# Patient Record
Sex: Female | Born: 1990 | Race: White | Hispanic: No | Marital: Married | Smoking: Former smoker
Health system: Southern US, Community
[De-identification: ages and names within clinical notes are randomized; demographics above are authoritative.]

## PROBLEM LIST (undated history)

## (undated) ENCOUNTER — Emergency Department (HOSPITAL_COMMUNITY): Admission: EM | Disposition: A | Discharge status: Other Acute Inpatient Hospital | Payer: Medicaid Other

## (undated) ENCOUNTER — Inpatient Hospital Stay (HOSPITAL_COMMUNITY): Payer: Self-pay

## (undated) ENCOUNTER — Emergency Department (HOSPITAL_COMMUNITY): Admission: EM | Payer: Medicaid Other | Source: Home / Self Care

## (undated) DIAGNOSIS — F419 Anxiety disorder, unspecified: Secondary | ICD-10-CM

## (undated) DIAGNOSIS — G43909 Migraine, unspecified, not intractable, without status migrainosus: Secondary | ICD-10-CM

## (undated) DIAGNOSIS — R87619 Unspecified abnormal cytological findings in specimens from cervix uteri: Secondary | ICD-10-CM

## (undated) HISTORY — DX: Unspecified abnormal cytological findings in specimens from cervix uteri: R87.619

## (undated) HISTORY — DX: Anxiety disorder, unspecified: F41.9

## (undated) HISTORY — PX: TONSILLECTOMY: SUR1361

---
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
## 2009-04-12 ENCOUNTER — Emergency Department (HOSPITAL_COMMUNITY): Admission: EM | Admit: 2009-04-12 | Discharge: 2009-04-12 | Payer: Self-pay | Admitting: Family Medicine

## 2009-10-26 ENCOUNTER — Emergency Department (HOSPITAL_COMMUNITY): Admission: EM | Admit: 2009-10-26 | Discharge: 2009-10-26 | Payer: Self-pay | Admitting: Family Medicine

## 2009-11-19 ENCOUNTER — Emergency Department (HOSPITAL_COMMUNITY): Admission: EM | Admit: 2009-11-19 | Discharge: 2009-11-19 | Payer: Self-pay | Admitting: Emergency Medicine

## 2010-10-30 LAB — POCT PREGNANCY, URINE: Preg Test, Ur: NEGATIVE
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
## 2011-03-03 ENCOUNTER — Emergency Department (HOSPITAL_COMMUNITY)
Admission: EM | Admit: 2011-03-03 | Discharge: 2011-03-03 | Disposition: A | Discharge status: Home / Self Care | Payer: Medicaid Other | Attending: Emergency Medicine | Admitting: Emergency Medicine

## 2011-03-03 DIAGNOSIS — R10813 Right lower quadrant abdominal tenderness: Secondary | ICD-10-CM | POA: Insufficient documentation

## 2011-03-03 DIAGNOSIS — N949 Unspecified condition associated with female genital organs and menstrual cycle: Secondary | ICD-10-CM | POA: Insufficient documentation

## 2011-03-03 DIAGNOSIS — R1031 Right lower quadrant pain: Secondary | ICD-10-CM | POA: Insufficient documentation

## 2011-03-03 DIAGNOSIS — O99891 Other specified diseases and conditions complicating pregnancy: Secondary | ICD-10-CM | POA: Insufficient documentation

## 2011-03-03 LAB — WET PREP, GENITAL
Trich, Wet Prep: NONE SEEN
Yeast Wet Prep HPF POC: NONE SEEN

## 2011-03-03 LAB — URINALYSIS, ROUTINE W REFLEX MICROSCOPIC
Bilirubin Urine: NEGATIVE
Glucose, UA: NEGATIVE mg/dL
Hgb urine dipstick: NEGATIVE
Nitrite: NEGATIVE
Protein, ur: NEGATIVE mg/dL
Specific Gravity, Urine: 1.021 (ref 1.005–1.030)

## 2011-04-16 LAB — CBC
HCT: 36.5
Hemoglobin: 12.7
MCHC: 34.7
MCV: 89.1
RBC: 4.09
RDW: 13.3

## 2011-04-16 LAB — GC/CHLAMYDIA PROBE AMP, URINE: GC Probe Amp, Urine: NEGATIVE

## 2011-04-16 LAB — HEPATIC FUNCTION PANEL
AST: 17
Alkaline Phosphatase: 56
Bilirubin, Direct: 0.1
Indirect Bilirubin: 0.3
Total Bilirubin: 0.4
Total Protein: 6.9

## 2011-04-16 LAB — URINALYSIS, ROUTINE W REFLEX MICROSCOPIC
Glucose, UA: NEGATIVE
Specific Gravity, Urine: 1.027
Urobilinogen, UA: 0.2
pH: 5.5

## 2011-04-16 LAB — DIFFERENTIAL
Basophils Absolute: 0
Basophils Relative: 1
Eosinophils Relative: 3
Lymphocytes Relative: 43
Monocytes Absolute: 0.6
Neutro Abs: 3.4

## 2011-04-16 LAB — DRUGS OF ABUSE SCREEN W/O ALC, ROUTINE URINE
Benzodiazepines.: NEGATIVE
Creatinine,U: 188.8
Marijuana Metabolite: NEGATIVE
Methadone: NEGATIVE
Opiate Screen, Urine: NEGATIVE
Phencyclidine (PCP): NEGATIVE

## 2011-04-16 LAB — BASIC METABOLIC PANEL
Chloride: 107
Potassium: 3.9

## 2011-04-16 LAB — PREGNANCY, URINE: Preg Test, Ur: NEGATIVE

## 2011-04-16 LAB — GAMMA GT: GGT: 10

## 2011-04-16 LAB — T4, FREE: Free T4: 1.06

## 2011-07-25 ENCOUNTER — Inpatient Hospital Stay (HOSPITAL_COMMUNITY): Admission: AD | Admit: 2011-07-25 | Payer: Self-pay | Source: Ambulatory Visit | Admitting: Obstetrics and Gynecology

## 2011-11-02 ENCOUNTER — Emergency Department (INDEPENDENT_AMBULATORY_CARE_PROVIDER_SITE_OTHER)
Admission: EM | Admit: 2011-11-02 | Discharge: 2011-11-02 | Disposition: A | Discharge status: Home / Self Care | Payer: Self-pay | Source: Home / Self Care | Attending: Emergency Medicine | Admitting: Emergency Medicine

## 2011-11-02 ENCOUNTER — Encounter (HOSPITAL_COMMUNITY): Payer: Self-pay | Admitting: Emergency Medicine

## 2011-11-02 DIAGNOSIS — L03011 Cellulitis of right finger: Secondary | ICD-10-CM

## 2011-11-02 DIAGNOSIS — L03019 Cellulitis of unspecified finger: Secondary | ICD-10-CM

## 2011-11-02 MED ORDER — LIDOCAINE HCL (PF) 2 % IJ SOLN
5.0000 mL | Freq: Once | INTRAMUSCULAR | Status: AC
  Administered 2011-11-02: 5 mL

## 2011-11-02 MED ORDER — CEPHALEXIN 500 MG PO CAPS: 500.0000 mg | ORAL_CAPSULE | Freq: Four times a day (QID) | ORAL | Status: AC

## 2011-11-02 MED ORDER — BACITRACIN 500 UNIT/GM EX OINT
1.0000 "application " | TOPICAL_OINTMENT | Freq: Once | CUTANEOUS | Status: AC
  Administered 2011-11-02: 1 via TOPICAL

## 2011-11-02 NOTE — ED Notes (Signed)
Right thumb infection.  Patient noticed pain, redness, pus approx 4-5 days ago.  Patient bites nails, cuticles.

## 2011-11-02 NOTE — Discharge Instructions (Signed)
Paronychia Paronychia is an inflammatory reaction involving the folds of the skin surrounding the fingernail. This is commonly caused by an infection in the skin around a nail. The most common cause of paronychia is frequent wetting of the hands (as seen with bartenders, food servers, nurses or others who wet their hands). This makes the skin around the fingernail susceptible to infection by bacteria (germs) or fungus. Other predisposing factors are:  Aggressive manicuring.   Nail biting.   Thumb sucking.  The most common cause is a staphylococcal (a type of germ) infection, or a fungal (Candida) infection. When caused by a germ, it usually comes on suddenly with redness, swelling, pus and is often painful. It may get under the nail and form an abscess (collection of pus), or form an abscess around the nail. If the nail itself is infected with a fungus, the treatment is usually prolonged and may require oral medicine for up to one year. Your caregiver will determine the length of time treatment is required. The paronychia caused by bacteria (germs) may largely be avoided by not pulling on hangnails or picking at cuticles. When the infection occurs at the tips of the finger it is called felon. When the cause of paronychia is from the herpes simplex virus (HSV) it is called herpetic whitlow. TREATMENT  When an abscess is present treatment is often incision and drainage. This means that the abscess must be cut open so the pus can get out. When this is done, the following home care instructions should be followed. HOME CARE INSTRUCTIONS   It is important to keep the affected fingers very dry. Rubber or plastic gloves over cotton gloves should be used whenever the hand must be placed in water.   Keep wound clean, dry and dressed as suggested by your caregiver between warm soaks or warm compresses.   Soak in warm water for fifteen to twenty minutes three to four times per day for bacterial infections.  Fungal infections are very difficult to treat, so often require treatment for long periods of time.   For bacterial (germ) infections take antibiotics (medicine which kill germs) as directed and finish the prescription, even if the problem appears to be solved before the medicine is gone.   Only take over-the-counter or prescription medicines for pain, discomfort, or fever as directed by your caregiver.  SEEK IMMEDIATE MEDICAL CARE IF:  You have redness, swelling, or increasing pain in the wound.   You notice pus coming from the wound.   You have a fever.   You notice a bad smell coming from the wound or dressing.  Document Released: 01/05/2001 Document Revised: 07/01/2011 Document Reviewed: 09/06/2008 [facility] Patient Information 2012 [place], [place].  Go to www.goodrx.com to look up your medications. This will give you a list of where you can find your prescriptions at the most affordable prices.   RESOURCE GUIDE   Insufficient Money for Medicine Contact [employer]:  call "211" or [facility] [phone].  No Primary Care Doctor Call [facility]  [phone] Other agencies that provide inexpensive medical care    [facility]  [phone]    [facility]  [phone]    [facility]  [phone]    [facility]  [phone] [address] [place] [address]    [facility]  [phone]    [facility]  [phone] [facility] [phone]   [address] [place] [address] [place], [place]  [address]  [facility] Resources  [facility]     [employer]                          [facility]. [address] [place]                       [address]      [address] [place] [address]   [phone] (After Hours)

## 2011-11-04 NOTE — ED Provider Notes (Signed)
History     CSN: [id number]  Arrival date & time 11/02/11  4098   First MD Initiated Contact with Patient 11/02/11 1936      Chief Complaint  Patient presents with  . Hand Pain    (Consider location/radiation/quality/duration/timing/severity/associated sxs/prior treatment) HPI Comments: Patient is a right-handed female who reports pain, swelling at the nail fold on her right thumb. No nausea, vomiting, redness extending up her thumb, numbness.. States she bites her nails. Pain is worse with palpation. No alleviating factors. Patient has not tried anything for this.  States tetanus is up-to-date. Patient is currently breast-feeding.  ROS as noted in HPI. All other ROS negative.   Patient is a 21 y.o. female presenting with hand pain. The history is provided by the patient. No language interpreter was used.  Hand Pain This is a new problem. The current episode started more than 2 days ago. The problem occurs constantly. The problem has been gradually worsening. The symptoms are aggravated by nothing. The symptoms are relieved by nothing. She has tried nothing for the symptoms.    Past Medical History  Diagnosis Date  . Breast feeding status of mother     Past Surgical History  Procedure Date  . Tonsillectomy     History reviewed. No pertinent family history.  History  Substance Use Topics  . Smoking status: Never Smoker   . Smokeless tobacco: Not on file  . Alcohol Use: No    OB History    Grav Para Term Preterm Abortions TAB SAB Ect Mult Living                  Review of Systems  Allergies  Review of patient's allergies indicates no known allergies.  Home Medications   Current Outpatient Rx  Name Route Sig Dispense Refill  . BACITRACIN-NEOMYCIN-POLYMYXIN [id number] EX OINT Topical Apply topically every 12 (twelve) hours. apply to eye    . NORETHINDRONE 0.35 MG PO TABS Oral Take 1 tablet by mouth daily.    . CEPHALEXIN 500 MG PO CAPS Oral Take 1 capsule (500  mg total) by mouth 4 (four) times daily. X 10 days 40 capsule 0    LMP 10/15/2011  Physical Exam  Nursing note and vitals reviewed. Constitutional: She is oriented to person, place, and time. She appears well-developed and well-nourished. No distress.  HENT:  Head: Normocephalic and atraumatic.  Eyes: Conjunctivae and EOM are normal.  Neck: Normal range of motion.  Cardiovascular: Normal rate.   Pulmonary/Chest: Effort normal.  Abdominal: She exhibits no distension.  Musculoskeletal: Normal range of motion.       Right hand: She exhibits normal range of motion and normal capillary refill. Normal strength noted.       Paronychia right thumb. No redness going up her digit. Sensation grossly intact.  Neurological: She is alert and oriented to person, place, and time.  Skin: Skin is warm and dry.  Psychiatric: She has a normal mood and affect. Her behavior is normal. Judgment and thought content normal.    ED Course  INCISION AND DRAINAGE Date/Time: 11/02/2011 12:43 PM Performed by: Luiz Blare Authorized by: Luiz Blare Consent: Verbal consent obtained. Risks and benefits: risks, benefits and alternatives were discussed Consent given by: patient Patient understanding: patient states understanding of the procedure being performed Patient consent: the patient's understanding of the procedure matches consent given Required items: required blood products, implants, devices, and special equipment available Patient identity confirmed: verbally with patient Time out: Immediately  prior to procedure a "time out" was called to verify the correct patient, procedure, equipment, support staff and site/side marked as required. Indications for incision and drainage: Paronychia. Location: Right thumb. Anesthesia: local infiltration Local anesthetic: lidocaine 2% without epinephrine Anesthetic total: 5 ml Patient sedated: no Scalpel size: 11 Incision type: single  straight Complexity: simple Drainage: purulent and bloody Drainage amount: moderate Wound treatment: wound left open Patient tolerance: Patient tolerated the procedure well with no immediate complications. Comments: Applied bacitracin and sterile pressure dressing.   (including critical care time)  Labs Reviewed - No data to display No results found.   1. Paronychia of right thumb       MDM          Luiz Blare, MD 11/04/11 1247

## 2013-02-16 ENCOUNTER — Encounter (HOSPITAL_COMMUNITY): Payer: Self-pay | Admitting: Emergency Medicine

## 2013-02-16 ENCOUNTER — Emergency Department (HOSPITAL_COMMUNITY): Payer: Self-pay

## 2013-02-16 ENCOUNTER — Emergency Department (HOSPITAL_COMMUNITY)
Admission: EM | Admit: 2013-02-16 | Discharge: 2013-02-16 | Disposition: A | Discharge status: Home / Self Care | Payer: Self-pay | Attending: Emergency Medicine | Admitting: Emergency Medicine

## 2013-02-16 DIAGNOSIS — Z8679 Personal history of other diseases of the circulatory system: Secondary | ICD-10-CM | POA: Insufficient documentation

## 2013-02-16 DIAGNOSIS — IMO0002 Reserved for concepts with insufficient information to code with codable children: Secondary | ICD-10-CM | POA: Insufficient documentation

## 2013-02-16 DIAGNOSIS — Z3490 Encounter for supervision of normal pregnancy, unspecified, unspecified trimester: Secondary | ICD-10-CM

## 2013-02-16 DIAGNOSIS — R110 Nausea: Secondary | ICD-10-CM | POA: Insufficient documentation

## 2013-02-16 DIAGNOSIS — R42 Dizziness and giddiness: Secondary | ICD-10-CM | POA: Insufficient documentation

## 2013-02-16 DIAGNOSIS — Z3201 Encounter for pregnancy test, result positive: Secondary | ICD-10-CM | POA: Insufficient documentation

## 2013-02-16 DIAGNOSIS — O9989 Other specified diseases and conditions complicating pregnancy, childbirth and the puerperium: Secondary | ICD-10-CM | POA: Insufficient documentation

## 2013-02-16 DIAGNOSIS — R109 Unspecified abdominal pain: Secondary | ICD-10-CM | POA: Insufficient documentation

## 2013-02-16 DIAGNOSIS — N898 Other specified noninflammatory disorders of vagina: Secondary | ICD-10-CM | POA: Insufficient documentation

## 2013-02-16 DIAGNOSIS — Z79899 Other long term (current) drug therapy: Secondary | ICD-10-CM | POA: Insufficient documentation

## 2013-02-16 HISTORY — DX: Migraine, unspecified, not intractable, without status migrainosus: G43.909

## 2013-02-16 LAB — CBC WITH DIFFERENTIAL/PLATELET
Eosinophils Relative: 1 % (ref 0–5)
Hemoglobin: 13.6 g/dL (ref 12.0–15.0)
Lymphocytes Relative: 23 % (ref 12–46)
Lymphs Abs: 2.2 10*3/uL (ref 0.7–4.0)
MCH: 31.9 pg (ref 26.0–34.0)
MCV: 88.5 fL (ref 78.0–100.0)
Monocytes Relative: 6 % (ref 3–12)
Neutrophils Relative %: 70 % (ref 43–77)
Platelets: 262 10*3/uL (ref 150–400)
RBC: 4.27 MIL/uL (ref 3.87–5.11)
WBC: 9.7 10*3/uL (ref 4.0–10.5)

## 2013-02-16 LAB — URINALYSIS, ROUTINE W REFLEX MICROSCOPIC
Bilirubin Urine: NEGATIVE
Glucose, UA: NEGATIVE mg/dL
Hgb urine dipstick: NEGATIVE
Specific Gravity, Urine: 1.026 (ref 1.005–1.030)

## 2013-02-16 LAB — BASIC METABOLIC PANEL
BUN: 12 mg/dL (ref 6–23)
CO2: 19 mEq/L (ref 19–32)
Glucose, Bld: 82 mg/dL (ref 70–99)
Potassium: 4 mEq/L (ref 3.5–5.1)
Sodium: 136 mEq/L (ref 135–145)

## 2013-02-16 LAB — WET PREP, GENITAL
Clue Cells Wet Prep HPF POC: NONE SEEN
Trich, Wet Prep: NONE SEEN
Yeast Wet Prep HPF POC: NONE SEEN

## 2013-02-16 LAB — URINE MICROSCOPIC-ADD ON

## 2013-02-16 MED ORDER — ONDANSETRON HCL 4 MG/2ML IJ SOLN
4.0000 mg | Freq: Once | INTRAMUSCULAR | Status: AC
  Administered 2013-02-16: 4 mg via INTRAVENOUS
  Filled 2013-02-16: qty 2

## 2013-02-16 MED ORDER — SODIUM CHLORIDE 0.9 % IV BOLUS (SEPSIS)
1000.0000 mL | Freq: Once | INTRAVENOUS | Status: AC
  Administered 2013-02-16: 1000 mL via INTRAVENOUS

## 2013-02-16 MED ORDER — NITROFURANTOIN MONOHYD MACRO 100 MG PO CAPS: 100.0000 mg | ORAL_CAPSULE | Freq: Two times a day (BID) | ORAL | Status: DC

## 2013-02-16 MED ORDER — ONDANSETRON 4 MG PO TBDP: 4.0000 mg | ORAL_TABLET | Freq: Three times a day (TID) | ORAL | Status: DC | PRN

## 2013-02-16 NOTE — ED Notes (Signed)
Patient transported to Ultrasound 

## 2013-02-16 NOTE — ED Notes (Addendum)
Pt states that is about 6 weeks preg via home preg test and LMP and has been dizzy and having sharp rt abd pain x  2-3 days has a slight vag d/c denies dysuria also staes she may have migrain G 2 P 1 A 0 L1 lmp 01/01/13 pt drove herself to er

## 2013-02-16 NOTE — ED Provider Notes (Signed)
CSN: [id number]     Arrival date & time 02/16/13  1005 History     First MD Initiated Contact with Patient 02/16/13 1010     Chief Complaint  Patient presents with  . Abdominal Pain  . Dizziness   (Consider location/radiation/quality/duration/timing/severity/associated sxs/prior Treatment) Patient is a 22 y.o. female presenting with abdominal pain. The history is provided by the patient and medical records.  Abdominal Pain Associated symptoms include abdominal pain and nausea.   Pt presents to the ED for abdominal pain and nausea.  Pt is a G2P1, approx [redacted] weeks pregnant (unplanned) via home pregnancy test. LMP 01/01/13.  Pain described as intermittent, sharp, lower abdominal, lasting approx a few seconds with each episode.  Pain is not changing in severity.  Some nausea, consistent with morning sickness but no vomiting.  No fevers, sweats, chills, or diarrhea.  No vaginal bleeding or vaginal d/c.  Decreased PO intake due to nausea.  Pt currently taking prenatals.  Past Medical History  Diagnosis Date  . Breast feeding status of mother   . Migraine    Past Surgical History  Procedure Laterality Date  . Tonsillectomy     No family history on file. History  Substance Use Topics  . Smoking status: Current Some Day Smoker  . Smokeless tobacco: Not on file  . Alcohol Use: No   OB History   Grav Para Term Preterm Abortions TAB SAB Ect Mult Living                 Review of Systems  Gastrointestinal: Positive for nausea and abdominal pain.  All other systems reviewed and are negative.    Allergies  Review of patient's allergies indicates no known allergies.  Home Medications   Current Outpatient Rx  Name  Route  Sig  Dispense  Refill  . neomycin-bacitracin-polymyxin (NEOSPORIN) ointment   Topical   Apply topically every 12 (twelve) hours. apply to eye         . norethindrone (CAMILA) 0.35 MG tablet   Oral   Take 1 tablet by mouth daily.          BP 111/71  Pulse  86  Temp(Src) 97.5 F (36.4 C) (Oral)  Resp 20  SpO2 99%  Physical Exam  Nursing note and vitals reviewed. Constitutional: She is oriented to person, place, and time. She appears well-developed and well-nourished.  HENT:  Head: Normocephalic and atraumatic.  Mouth/Throat: Oropharynx is clear and moist.  Eyes: Conjunctivae and EOM are normal. Pupils are equal, round, and reactive to light.  Neck: Normal range of motion.  Cardiovascular: Normal rate, regular rhythm and normal heart sounds.   Pulmonary/Chest: Effort normal and breath sounds normal.  Abdominal: Soft. Bowel sounds are normal. There is no tenderness. There is no rigidity, no guarding, no CVA tenderness, no tenderness at McBurney's point and negative Murphy's sign.  No TTP at present  Genitourinary: There is no lesion on the right labia. There is no lesion on the left labia. Cervix exhibits no motion tenderness. Right adnexum displays no tenderness. Left adnexum displays no tenderness. No bleeding around the vagina. Vaginal discharge found.  Cervical os closed, moderate amount of non-odorous vaginal discharge, no adnexal or CMT  Musculoskeletal: Normal range of motion.  Neurological: She is alert and oriented to person, place, and time.  Skin: Skin is warm and dry.  Psychiatric: She has a normal mood and affect.    ED Course   Procedures (including critical care time)  Labs Reviewed  WET PREP, GENITAL - Abnormal; Notable for the following:    WBC, Wet Prep HPF POC MANY (*)    All other components within normal limits  URINALYSIS, ROUTINE W REFLEX MICROSCOPIC - Abnormal; Notable for the following:    Color, Urine AMBER (*)    APPearance HAZY (*)    Leukocytes, UA SMALL (*)    All other components within normal limits  HCG, QUANTITATIVE, PREGNANCY - Abnormal; Notable for the following:    hCG, Beta Chain, Quant, S 21919 (*)    All other components within normal limits  URINE MICROSCOPIC-ADD ON - Abnormal; Notable for  the following:    Squamous Epithelial / LPF MANY (*)    Bacteria, UA FEW (*)    All other components within normal limits  POCT PREGNANCY, URINE - Abnormal; Notable for the following:    Preg Test, Ur POSITIVE (*)    All other components within normal limits  GC/CHLAMYDIA PROBE AMP  URINE CULTURE  CBC WITH DIFFERENTIAL  BASIC METABOLIC PANEL   US Ob Comp Less 14 Wks  02/16/2013   *RADIOLOGY REPORT*  Clinical Data: ABDOMINAL PAIN DIZZINESS,  OBSTETRIC <14 WK [place]  Technique: Transabdominal ultrasound examination was performed for complete evaluation of the gestation as well as the maternal uterus, adnexal regions, and pelvic cul-de-sac.  Comparison: None  Findings: There is a single intrauterine gestation.  Crown-rump length is 5 mm for an estimated gestational age of [redacted] weeks 2 days. Fetal heart rate 118 beats per minute.  No subchorionic hemorrhage.  Right ovary unremarkable.  Small left corpus luteal cyst.  No free fluid.  IMPRESSION: Single intrauterine pregnancy, 6 weeks 2 days with fetal heart rate 118 beats per minute.   Original Report Authenticated By: Charlett Nose, M.D.   1. Pregnancy   2. Abdominal  pain, other specified site     MDM   U/s with stable fetal HR at 118, est 6 wk 2d.  Labs reassuring.  U/a with small leuks and few bacteria-- will send for culture and ppx with short course abx.  Wet prep many WBC.  Pt tolerating PO in the ED without difficulty, states she feels much better.  Continue taking prenatals.  Rx zofran and macrobid.  [facility]  Discussed plan with pt, she agreed.  Return precautions advised.  Garlon Hatchet, PA-C 02/16/13 1546

## 2013-02-17 LAB — URINE CULTURE: Colony Count: 4000

## 2013-02-17 LAB — GC/CHLAMYDIA PROBE AMP
CT Probe RNA: NEGATIVE
GC Probe RNA: NEGATIVE

## 2013-02-17 NOTE — ED Provider Notes (Signed)
Medical screening examination/treatment/procedure(s) were performed by non-physician practitioner and as supervising physician I was immediately available for consultation/collaboration.   Ashby Dawes, MD 02/17/13 [phone]

## 2013-03-12 ENCOUNTER — Encounter: Payer: Self-pay | Admitting: Obstetrics & Gynecology

## 2013-03-27 ENCOUNTER — Encounter: Payer: Self-pay | Admitting: *Deleted

## 2013-04-10 ENCOUNTER — Encounter: Payer: Self-pay | Admitting: Obstetrics & Gynecology

## 2013-04-10 ENCOUNTER — Ambulatory Visit (INDEPENDENT_AMBULATORY_CARE_PROVIDER_SITE_OTHER): Payer: Medicaid Other | Admitting: Obstetrics and Gynecology

## 2013-04-10 VITALS — BP 102/71 | Temp 98.5°F | Ht 65.0 in | Wt 158.1 lb

## 2013-04-10 DIAGNOSIS — Z3482 Encounter for supervision of other normal pregnancy, second trimester: Secondary | ICD-10-CM

## 2013-04-10 DIAGNOSIS — Z3480 Encounter for supervision of other normal pregnancy, unspecified trimester: Secondary | ICD-10-CM | POA: Insufficient documentation

## 2013-04-10 LAB — POCT URINALYSIS DIP (DEVICE)
Bilirubin Urine: NEGATIVE
Hgb urine dipstick: NEGATIVE
Nitrite: NEGATIVE
Protein, ur: NEGATIVE mg/dL
pH: 7 (ref 5.0–8.0)

## 2013-04-10 NOTE — Patient Instructions (Signed)
Pregnancy - Second Trimester The second trimester of pregnancy (3 to 6 months) is a period of rapid growth for you and your baby. At the end of the sixth month, your baby is about 9 inches long and weighs 1 1/2 pounds. You will begin to feel the baby move between 18 and 20 weeks of the pregnancy. This is called quickening. Weight gain is faster. A clear fluid (colostrum) may leak out of your breasts. You may feel small contractions of the womb (uterus). This is known as false labor or Braxton-Hicks contractions. This is like a practice for labor when the baby is ready to be born. Usually, the problems with morning sickness have usually passed by the end of your first trimester. Some women develop small dark blotches (called cholasma, mask of pregnancy) on their face that usually goes away after the baby is born. Exposure to the sun makes the blotches worse. Acne may also develop in some pregnant women and pregnant women who have acne, may find that it goes away. PRENATAL EXAMS  Blood work may continue to be done during prenatal exams. These tests are done to check on your health and the probable health of your baby. Blood work is used to follow your blood levels (hemoglobin). Anemia (low hemoglobin) is common during pregnancy. Iron and vitamins are given to help prevent this. You will also be checked for diabetes between 24 and 28 weeks of the pregnancy. Some of the previous blood tests may be repeated.  The size of the uterus is measured during each visit. This is to make sure that the baby is continuing to grow properly according to the dates of the pregnancy.  Your blood pressure is checked every prenatal visit. This is to make sure you are not getting toxemia.  Your urine is checked to make sure you do not have an infection, diabetes or protein in the urine.  Your weight is checked often to make sure gains are happening at the suggested rate. This is to ensure that both you and your baby are  growing normally.  Sometimes, an ultrasound is performed to confirm the proper growth and development of the baby. This is a test which bounces harmless sound waves off the baby so your caregiver can more accurately determine due dates. Sometimes, a test is done on the amniotic fluid surrounding the baby. This test is called an amniocentesis. The amniotic fluid is obtained by sticking a needle into the belly (abdomen). This is done to check the chromosomes in instances where there is a concern about possible genetic problems with the baby. It is also sometimes done near the end of pregnancy if an early delivery is required. In this case, it is done to help make sure the baby's lungs are mature enough for the baby to live outside of the womb. CHANGES OCCURING IN THE SECOND TRIMESTER OF PREGNANCY Your body goes through many changes during pregnancy. They vary from person to person. Talk to your caregiver about changes you notice that you are concerned about.  During the second trimester, you will likely have an increase in your appetite. It is normal to have cravings for certain foods. This varies from person to person and pregnancy to pregnancy.  Your lower abdomen will begin to bulge.  You may have to urinate more often because the uterus and baby are pressing on your bladder. It is also common to get more bladder infections during pregnancy. You can help this by drinking lots of fluids   and emptying your bladder before and after intercourse.  You may begin to get stretch marks on your hips, abdomen, and breasts. These are normal changes in the body during pregnancy. There are no exercises or medicines to take that prevent this change.  You may begin to develop swollen and bulging veins (varicose veins) in your legs. Wearing support hose, elevating your feet for 15 minutes, 3 to 4 times a day and limiting salt in your diet helps lessen the problem.  Heartburn may develop as the uterus grows and  pushes up against the stomach. Antacids recommended by your caregiver helps with this problem. Also, eating smaller meals 4 to 5 times a day helps.  Constipation can be treated with a stool softener or adding bulk to your diet. Drinking lots of fluids, and eating vegetables, fruits, and whole grains are helpful.  Exercising is also helpful. If you have been very active up until your pregnancy, most of these activities can be continued during your pregnancy. If you have been less active, it is helpful to start an exercise program such as walking.  Hemorrhoids may develop at the end of the second trimester. Warm sitz baths and hemorrhoid cream recommended by your caregiver helps hemorrhoid problems.  Backaches may develop during this time of your pregnancy. Avoid heavy lifting, wear low heal shoes, and practice good posture to help with backache problems.  Some pregnant women develop tingling and numbness of their hand and fingers because of swelling and tightening of ligaments in the wrist (carpel tunnel syndrome). This goes away after the baby is born.  As your breasts enlarge, you may have to get a bigger bra. Get a comfortable, cotton, support bra. Do not get a nursing bra until the last month of the pregnancy if you will be nursing the baby.  You may get a dark line from your belly button to the pubic area called the linea nigra.  You may develop rosy cheeks because of increase blood flow to the face.  You may develop spider looking lines of the face, neck, arms, and chest. These go away after the baby is born. HOME CARE INSTRUCTIONS   It is extremely important to avoid all smoking, herbs, alcohol, and unprescribed drugs during your pregnancy. These chemicals affect the formation and growth of the baby. Avoid these chemicals throughout the pregnancy to ensure the delivery of a healthy infant.  Most of your home care instructions are the same as suggested for the first trimester of your  pregnancy. Keep your caregiver's appointments. Follow your caregiver's instructions regarding medicine use, exercise, and diet.  During pregnancy, you are providing food for you and your baby. Continue to eat regular, well-balanced meals. Choose foods such as meat, fish, milk and other low fat dairy products, vegetables, fruits, and whole-grain breads and cereals. Your caregiver will tell you of the ideal weight gain.  A physical sexual relationship may be continued up until near the end of pregnancy if there are no other problems. Problems could include early (premature) leaking of amniotic fluid from the membranes, vaginal bleeding, abdominal pain, or other medical or pregnancy problems.  Exercise regularly if there are no restrictions. Check with your caregiver if you are unsure of the safety of some of your exercises. The greatest weight gain will occur in the last 2 trimesters of pregnancy. Exercise will help you:  Control your weight.  Get you in shape for labor and delivery.  Lose weight after you have the baby.  Wear   a good support or jogging bra for breast tenderness during pregnancy. This may help if worn during sleep. Pads or tissues may be used in the bra if you are leaking colostrum.  Do not use hot tubs, steam rooms or saunas throughout the pregnancy.  Wear your seat belt at all times when driving. This protects you and your baby if you are in an accident.  Avoid raw meat, uncooked cheese, cat litter boxes, and soil used by cats. These carry germs that can cause birth defects in the baby.  The second trimester is also a good time to visit your dentist for your dental health if this has not been done yet. Getting your teeth cleaned is okay. Use a soft toothbrush. Brush gently during pregnancy.  It is easier to leak urine during pregnancy. Tightening up and strengthening the pelvic muscles will help with this problem. Practice stopping your urination while you are going to the  bathroom. These are the same muscles you need to strengthen. It is also the muscles you would use as if you were trying to stop from passing gas. You can practice tightening these muscles up 10 times a set and repeating this about 3 times per day. Once you know what muscles to tighten up, do not perform these exercises during urination. It is more likely to contribute to an infection by backing up the urine.  Ask for help if you have financial, counseling, or nutritional needs during pregnancy. Your caregiver will be able to offer counseling for these needs as well as refer you for other special needs.  Your skin may become oily. If so, wash your face with mild soap, use non-greasy moisturizer and oil or cream based makeup. MEDICINES AND DRUG USE IN PREGNANCY  Take prenatal vitamins as directed. The vitamin should contain 1 milligram of folic acid. Keep all vitamins out of reach of children. Only a couple vitamins or tablets containing iron may be fatal to a baby or young child when ingested.  Avoid use of all medicines, including herbs, over-the-counter medicines, not prescribed or suggested by your caregiver. Only take over-the-counter or prescription medicines for pain, discomfort, or fever as directed by your caregiver. Do not use aspirin.  Let your caregiver also know about herbs you may be using.  Alcohol is related to a number of birth defects. This includes fetal alcohol syndrome. All alcohol, in any form, should be avoided completely. Smoking will cause low birth rate and premature babies.  Street or illegal drugs are very harmful to the baby. They are absolutely forbidden. A baby born to an addicted mother will be addicted at birth. The baby will go through the same withdrawal an adult does. SEEK MEDICAL CARE IF:  You have any concerns or worries during your pregnancy. It is better to call with your questions if you feel they cannot wait, rather than worry about them. SEEK IMMEDIATE  MEDICAL CARE IF:   An unexplained oral temperature above 102 F (38.9 C) develops, or as your caregiver suggests.  You have leaking of fluid from the vagina (birth canal). If leaking membranes are suspected, take your temperature and tell your caregiver of this when you call.  There is vaginal spotting, bleeding, or passing clots. Tell your caregiver of the amount and how many pads are used. Light spotting in pregnancy is common, especially following intercourse.  You develop a bad smelling vaginal discharge with a change in the color from clear to white.  You continue to feel   sick to your stomach (nauseated) and have no relief from remedies suggested. You vomit blood or coffee ground-like materials.  You lose more than 2 pounds of weight or gain more than 2 pounds of weight over 1 week, or as suggested by your caregiver.  You notice swelling of your face, hands, feet, or legs.  You get exposed to German measles and have never had them.  You are exposed to fifth disease or chickenpox.  You develop belly (abdominal) pain. Round ligament discomfort is a common non-cancerous (benign) cause of abdominal pain in pregnancy. Your caregiver still must evaluate you.  You develop a bad headache that does not go away.  You develop fever, diarrhea, pain with urination, or shortness of breath.  You develop visual problems, blurry, or double vision.  You fall or are in a car accident or any kind of trauma.  There is mental or physical violence at home. Document Released: 07/06/2001 Document Revised: 04/05/2012 Document Reviewed: 01/08/2009 [employer] Patient Information 2014 [employer]  

## 2013-04-10 NOTE — Progress Notes (Signed)
Pulse- 79 Patient states she had episodes of cramping, nausea, vomiting, & diarrhea last night

## 2013-04-10 NOTE — Progress Notes (Signed)
   Subjective:    [name] [name] is a G2P1001 [redacted]w[redacted]d being seen today for her first obstetrical visit.  Her obstetrical history is significant for LGA (3( oz with no GDM or SD.  Patient does intend to breast feed. Pregnancy history fully reviewed.  Patient reports no complaints.  Filed Vitals:   04/10/13 0817 04/10/13 0818  BP: 102/71   Temp: 98.5 F (36.9 C)   Height:  5\' 5"  (1.651 m)  Weight: 158 lb 1.6 oz (71.714 kg)     HISTORY: OB History  Gravida Para Term Preterm AB SAB TAB Ectopic Multiple Living  2 1 1  0 0 0 0 0 0 1    # Outcome Date GA Lbr Len/2nd Weight Sex Delivery Anes PTL Lv  2 CUR           1 TRM 08/13/11 [redacted]w[redacted]d  9 lb 9 oz (4.338 kg) F SVD None  Y     Comments: LGA 9#9oz, no GDM or SD     Past Medical History  Diagnosis Date  . Breast feeding status of mother   . Migraine    Past Surgical History  Procedure Laterality Date  . Tonsillectomy     Family History  Problem Relation Age of Onset  . Cancer Maternal Grandmother   . Hypertension Paternal Grandmother   . Diabetes Paternal Grandmother      Exam    Uterus:     Pelvic Exam:    Perineum: Normal Perineum   Vulva: normal, Bartholin's, Urethra, Skene's normal   Vagina:  normal discharge       Cervix: no bleeding following Pap and no cervical motion tenderness   Adnexa: not evaluated   Bony Pelvis: gynecoid and proven to 9-9  System: Breast:  normal appearance, no masses or tenderness, Inspection negative, No nipple retraction or dimpling, No nipple discharge or bleeding   Skin: normal coloration and turgor, no rashes    Neurologic: oriented, normal, grossly non-focal   Extremities: normal strength, tone, and muscle mass, no deformities   HEENT PERRLA, neck supple with midline trachea and thyroid without masses   Mouth/Teeth mucous membranes moist, pharynx normal without lesions and dental hygiene good   Neck supple and no masses   Cardiovascular: regular rate and rhythm   Respiratory:   appears well, vitals normal, no respiratory distress, acyanotic, normal RR, ear and throat exam is normal, neck free of mass or lymphadenopathy, chest clear, no wheezing, crepitations, rhonchi, normal symmetric air entry   Abdomen: S=D, DT 148   Urinary: urethral meatus normal      Assessment:    Pregnancy: G2P1001 Patient Active Problem List   Diagnosis Date Noted  . Supervision of normal subsequent pregnancy 04/10/2013  Hx LGA      Plan:     Initial labs drawn. Prenatal vitamins. Problem list reviewed and updated. Genetic Screening discussed Quad Screen: requested.  Ultrasound discussed; fetal survey: requested.  Follow up in 4 weeks. 50% of 30 min visit spent on counseling and coordination of care.     POE,DEIRDRE 04/10/2013

## 2013-04-11 LAB — OBSTETRIC PANEL
Antibody Screen: NEGATIVE
Basophils Relative: 0 % (ref 0–1)
Eosinophils Absolute: 0.1 10*3/uL (ref 0.0–0.7)
Hemoglobin: 12.8 g/dL (ref 12.0–15.0)
Hepatitis B Surface Ag: NEGATIVE
MCH: 30.3 pg (ref 26.0–34.0)
MCHC: 34.2 g/dL (ref 30.0–36.0)
Monocytes Absolute: 0.4 10*3/uL (ref 0.1–1.0)
Monocytes Relative: 5 % (ref 3–12)
Neutrophils Relative %: 74 % (ref 43–77)
Rh Type: POSITIVE

## 2013-04-12 LAB — CULTURE, OB URINE
Colony Count: NO GROWTH
Organism ID, Bacteria: NO GROWTH

## 2013-05-08 ENCOUNTER — Encounter: Payer: Self-pay | Admitting: Family Medicine

## 2013-05-08 ENCOUNTER — Ambulatory Visit (INDEPENDENT_AMBULATORY_CARE_PROVIDER_SITE_OTHER): Payer: Self-pay | Admitting: Family Medicine

## 2013-05-08 VITALS — BP 126/71 | Temp 99.4°F | Wt 161.0 lb

## 2013-05-08 DIAGNOSIS — Z3480 Encounter for supervision of other normal pregnancy, unspecified trimester: Secondary | ICD-10-CM

## 2013-05-08 DIAGNOSIS — Z3482 Encounter for supervision of other normal pregnancy, second trimester: Secondary | ICD-10-CM

## 2013-05-08 DIAGNOSIS — Z23 Encounter for immunization: Secondary | ICD-10-CM

## 2013-05-08 LAB — POCT URINALYSIS DIP (DEVICE)
Bilirubin Urine: NEGATIVE
Hgb urine dipstick: NEGATIVE
Ketones, ur: NEGATIVE mg/dL
pH: 5.5 (ref 5.0–8.0)

## 2013-05-08 NOTE — Progress Notes (Signed)
P=87,  

## 2013-05-08 NOTE — Patient Instructions (Signed)
Pregnancy - Second Trimester The second trimester of pregnancy (3 to 6 months) is a period of rapid growth for you and your baby. At the end of the sixth month, your baby is about 9 inches long and weighs 1 1/2 pounds. You will begin to feel the baby move between 18 and 20 weeks of the pregnancy. This is called quickening. Weight gain is faster. A clear fluid (colostrum) may leak out of your breasts. You may feel small contractions of the womb (uterus). This is known as false labor or Braxton-Hicks contractions. This is like a practice for labor when the baby is ready to be born. Usually, the problems with morning sickness have usually passed by the end of your first trimester. Some women develop small dark blotches (called cholasma, mask of pregnancy) on their face that usually goes away after the baby is born. Exposure to the sun makes the blotches worse. Acne may also develop in some pregnant women and pregnant women who have acne, may find that it goes away. PRENATAL EXAMS  Blood work may continue to be done during prenatal exams. These tests are done to check on your health and the probable health of your baby. Blood work is used to follow your blood levels (hemoglobin). Anemia (low hemoglobin) is common during pregnancy. Iron and vitamins are given to help prevent this. You will also be checked for diabetes between 24 and 28 weeks of the pregnancy. Some of the previous blood tests may be repeated.  The size of the uterus is measured during each visit. This is to make sure that the baby is continuing to grow properly according to the dates of the pregnancy.  Your blood pressure is checked every prenatal visit. This is to make sure you are not getting toxemia.  Your urine is checked to make sure you do not have an infection, diabetes or protein in the urine.  Your weight is checked often to make sure gains are happening at the suggested rate. This is to ensure that both you and your baby are  growing normally.  Sometimes, an ultrasound is performed to confirm the proper growth and development of the baby. This is a test which bounces harmless sound waves off the baby so your caregiver can more accurately determine due dates. Sometimes, a test is done on the amniotic fluid surrounding the baby. This test is called an amniocentesis. The amniotic fluid is obtained by sticking a needle into the belly (abdomen). This is done to check the chromosomes in instances where there is a concern about possible genetic problems with the baby. It is also sometimes done near the end of pregnancy if an early delivery is required. In this case, it is done to help make sure the baby's lungs are mature enough for the baby to live outside of the womb. CHANGES OCCURING IN THE SECOND TRIMESTER OF PREGNANCY Your body goes through many changes during pregnancy. They vary from person to person. Talk to your caregiver about changes you notice that you are concerned about.  During the second trimester, you will likely have an increase in your appetite. It is normal to have cravings for certain foods. This varies from person to person and pregnancy to pregnancy.  Your lower abdomen will begin to bulge.  You may have to urinate more often because the uterus and baby are pressing on your bladder. It is also common to get more bladder infections during pregnancy. You can help this by drinking lots of fluids   and emptying your bladder before and after intercourse.  You may begin to get stretch marks on your hips, abdomen, and breasts. These are normal changes in the body during pregnancy. There are no exercises or medicines to take that prevent this change.  You may begin to develop swollen and bulging veins (varicose veins) in your legs. Wearing support hose, elevating your feet for 15 minutes, 3 to 4 times a day and limiting salt in your diet helps lessen the problem.  Heartburn may develop as the uterus grows and  pushes up against the stomach. Antacids recommended by your caregiver helps with this problem. Also, eating smaller meals 4 to 5 times a day helps.  Constipation can be treated with a stool softener or adding bulk to your diet. Drinking lots of fluids, and eating vegetables, fruits, and whole grains are helpful.  Exercising is also helpful. If you have been very active up until your pregnancy, most of these activities can be continued during your pregnancy. If you have been less active, it is helpful to start an exercise program such as walking.  Hemorrhoids may develop at the end of the second trimester. Warm sitz baths and hemorrhoid cream recommended by your caregiver helps hemorrhoid problems.  Backaches may develop during this time of your pregnancy. Avoid heavy lifting, wear low heal shoes, and practice good posture to help with backache problems.  Some pregnant women develop tingling and numbness of their hand and fingers because of swelling and tightening of ligaments in the wrist (carpel tunnel syndrome). This goes away after the baby is born.  As your breasts enlarge, you may have to get a bigger bra. Get a comfortable, cotton, support bra. Do not get a nursing bra until the last month of the pregnancy if you will be nursing the baby.  You may get a dark line from your belly button to the pubic area called the linea nigra.  You may develop rosy cheeks because of increase blood flow to the face.  You may develop spider looking lines of the face, neck, arms, and chest. These go away after the baby is born. HOME CARE INSTRUCTIONS   It is extremely important to avoid all smoking, herbs, alcohol, and unprescribed drugs during your pregnancy. These chemicals affect the formation and growth of the baby. Avoid these chemicals throughout the pregnancy to ensure the delivery of a healthy infant.  Most of your home care instructions are the same as suggested for the first trimester of your  pregnancy. Keep your caregiver's appointments. Follow your caregiver's instructions regarding medicine use, exercise, and diet.  During pregnancy, you are providing food for you and your baby. Continue to eat regular, well-balanced meals. Choose foods such as meat, fish, milk and other low fat dairy products, vegetables, fruits, and whole-grain breads and cereals. Your caregiver will tell you of the ideal weight gain.  A physical sexual relationship may be continued up until near the end of pregnancy if there are no other problems. Problems could include early (premature) leaking of amniotic fluid from the membranes, vaginal bleeding, abdominal pain, or other medical or pregnancy problems.  Exercise regularly if there are no restrictions. Check with your caregiver if you are unsure of the safety of some of your exercises. The greatest weight gain will occur in the last 2 trimesters of pregnancy. Exercise will help you:  Control your weight.  Get you in shape for labor and delivery.  Lose weight after you have the baby.  Wear   a good support or jogging bra for breast tenderness during pregnancy. This may help if worn during sleep. Pads or tissues may be used in the bra if you are leaking colostrum.  Do not use hot tubs, steam rooms or saunas throughout the pregnancy.  Wear your seat belt at all times when driving. This protects you and your baby if you are in an accident.  Avoid raw meat, uncooked cheese, cat litter boxes, and soil used by cats. These carry germs that can cause birth defects in the baby.  The second trimester is also a good time to visit your dentist for your dental health if this has not been done yet. Getting your teeth cleaned is okay. Use a soft toothbrush. Brush gently during pregnancy.  It is easier to leak urine during pregnancy. Tightening up and strengthening the pelvic muscles will help with this problem. Practice stopping your urination while you are going to the  bathroom. These are the same muscles you need to strengthen. It is also the muscles you would use as if you were trying to stop from passing gas. You can practice tightening these muscles up 10 times a set and repeating this about 3 times per day. Once you know what muscles to tighten up, do not perform these exercises during urination. It is more likely to contribute to an infection by backing up the urine.  Ask for help if you have financial, counseling, or nutritional needs during pregnancy. Your caregiver will be able to offer counseling for these needs as well as refer you for other special needs.  Your skin may become oily. If so, wash your face with mild soap, use non-greasy moisturizer and oil or cream based makeup. MEDICINES AND DRUG USE IN PREGNANCY  Take prenatal vitamins as directed. The vitamin should contain 1 milligram of folic acid. Keep all vitamins out of reach of children. Only a couple vitamins or tablets containing iron may be fatal to a baby or young child when ingested.  Avoid use of all medicines, including herbs, over-the-counter medicines, not prescribed or suggested by your caregiver. Only take over-the-counter or prescription medicines for pain, discomfort, or fever as directed by your caregiver. Do not use aspirin.  Let your caregiver also know about herbs you may be using.  Alcohol is related to a number of birth defects. This includes fetal alcohol syndrome. All alcohol, in any form, should be avoided completely. Smoking will cause low birth rate and premature babies.  Street or illegal drugs are very harmful to the baby. They are absolutely forbidden. A baby born to an addicted mother will be addicted at birth. The baby will go through the same withdrawal an adult does. SEEK MEDICAL CARE IF:  You have any concerns or worries during your pregnancy. It is better to call with your questions if you feel they cannot wait, rather than worry about them. SEEK IMMEDIATE  MEDICAL CARE IF:   An unexplained oral temperature above 102 F (38.9 C) develops, or as your caregiver suggests.  You have leaking of fluid from the vagina (birth canal). If leaking membranes are suspected, take your temperature and tell your caregiver of this when you call.  There is vaginal spotting, bleeding, or passing clots. Tell your caregiver of the amount and how many pads are used. Light spotting in pregnancy is common, especially following intercourse.  You develop a bad smelling vaginal discharge with a change in the color from clear to white.  You continue to feel   sick to your stomach (nauseated) and have no relief from remedies suggested. You vomit blood or coffee ground-like materials.  You lose more than 2 pounds of weight or gain more than 2 pounds of weight over 1 week, or as suggested by your caregiver.  You notice swelling of your face, hands, feet, or legs.  You get exposed to German measles and have never had them.  You are exposed to fifth disease or chickenpox.  You develop belly (abdominal) pain. Round ligament discomfort is a common non-cancerous (benign) cause of abdominal pain in pregnancy. Your caregiver still must evaluate you.  You develop a bad headache that does not go away.  You develop fever, diarrhea, pain with urination, or shortness of breath.  You develop visual problems, blurry, or double vision.  You fall or are in a car accident or any kind of trauma.  There is mental or physical violence at home. Document Released: 07/06/2001 Document Revised: 04/05/2012 Document Reviewed: 01/08/2009 [employer] Patient Information 2014 [employer]  

## 2013-05-08 NOTE — Progress Notes (Signed)
Pt is a G2P1001 @ [redacted]w[redacted]d - here for routine obv - wanting quad screen today - no ctx, vb, LOF. +FM  O: see flowsheet  A/P - quad today - will schedule ultrasound - f/u in 4 weeks.

## 2013-05-10 LAB — AFP, QUAD SCREEN
AFP: 58.2 IU/mL
Curr Gest Age: 17.6 wks.days
MoM for AFP: 1.55
Trisomy 18 (Edward) Syndrome Interp.: <1:154000 {titer}
uE3 Value: 1.5 ng/mL

## 2013-05-14 ENCOUNTER — Ambulatory Visit (HOSPITAL_COMMUNITY): Payer: MEDICAID

## 2013-05-15 ENCOUNTER — Other Ambulatory Visit: Payer: Self-pay | Admitting: Family Medicine

## 2013-05-15 ENCOUNTER — Ambulatory Visit (HOSPITAL_COMMUNITY)
Admission: RE | Admit: 2013-05-15 | Discharge: 2013-05-15 | Disposition: A | Discharge status: Home / Self Care | Payer: Self-pay | Source: Ambulatory Visit | Attending: Family Medicine | Admitting: Family Medicine

## 2013-05-15 DIAGNOSIS — Z3689 Encounter for other specified antenatal screening: Secondary | ICD-10-CM | POA: Insufficient documentation

## 2013-05-15 DIAGNOSIS — Z3482 Encounter for supervision of other normal pregnancy, second trimester: Secondary | ICD-10-CM

## 2013-05-18 ENCOUNTER — Encounter: Payer: Self-pay | Admitting: Family Medicine

## 2013-06-05 ENCOUNTER — Encounter: Payer: Self-pay | Admitting: Family Medicine

## 2013-06-19 ENCOUNTER — Ambulatory Visit (INDEPENDENT_AMBULATORY_CARE_PROVIDER_SITE_OTHER): Payer: Self-pay | Admitting: Advanced Practice Midwife

## 2013-06-19 ENCOUNTER — Encounter: Payer: Self-pay | Admitting: Advanced Practice Midwife

## 2013-06-19 VITALS — BP 123/80 | Temp 97.0°F | Wt 174.0 lb

## 2013-06-19 DIAGNOSIS — N390 Urinary tract infection, site not specified: Secondary | ICD-10-CM

## 2013-06-19 DIAGNOSIS — Z3482 Encounter for supervision of other normal pregnancy, second trimester: Secondary | ICD-10-CM

## 2013-06-19 DIAGNOSIS — Z3480 Encounter for supervision of other normal pregnancy, unspecified trimester: Secondary | ICD-10-CM

## 2013-06-19 LAB — POCT URINALYSIS DIP (DEVICE)
Ketones, ur: NEGATIVE mg/dL
Protein, ur: NEGATIVE mg/dL
Specific Gravity, Urine: >=1.03 (ref 1.005–1.030)
pH: 5.5 (ref 5.0–8.0)

## 2013-06-19 NOTE — Progress Notes (Signed)
Had one episode of frequent contractions (4 in 20 minutes) but did not repeat.  Now has a couple per day. Informed quad screen negative.  Mod Leuk in urine, will culture

## 2013-06-19 NOTE — Progress Notes (Signed)
Pulse-  85 Pt reports having contractions x 1

## 2013-06-19 NOTE — Patient Instructions (Signed)
Second Trimester of Pregnancy The second trimester is from week 13 through week 28, months 4 through 6. The second trimester is often a time when you feel your best. Your body has also adjusted to being pregnant, and you begin to feel better physically. Usually, morning sickness has lessened or quit completely, you may have more energy, and you may have an increase in appetite. The second trimester is also a time when the fetus is growing rapidly. At the end of the sixth month, the fetus is about 9 inches long and weighs about 1 pounds. You will likely begin to feel the baby move (quickening) between 18 and 20 weeks of the pregnancy. BODY CHANGES Your body goes through many changes during pregnancy. The changes vary from woman to woman.   Your weight will continue to increase. You will notice your lower abdomen bulging out.  You may begin to get stretch marks on your hips, abdomen, and breasts.  You may develop headaches that can be relieved by medicines approved by your caregiver.  You may urinate more often because the fetus is pressing on your bladder.  You may develop or continue to have heartburn as a result of your pregnancy.  You may develop constipation because certain hormones are causing the muscles that push waste through your intestines to slow down.  You may develop hemorrhoids or swollen, bulging veins (varicose veins).  You may have back pain because of the weight gain and pregnancy hormones relaxing your joints between the bones in your pelvis and as a result of a shift in weight and the muscles that support your balance.  Your breasts will continue to grow and be tender.  Your gums may bleed and may be sensitive to brushing and flossing.  Dark spots or blotches (chloasma, mask of pregnancy) may develop on your face. This will likely fade after the baby is born.  A dark line from your belly button to the pubic area (linea nigra) may appear. This will likely fade after the  baby is born. WHAT TO EXPECT AT YOUR PRENATAL VISITS During a routine prenatal visit:  You will be weighed to make sure you and the fetus are growing normally.  Your blood pressure will be taken.  Your abdomen will be measured to track your baby's growth.  The fetal heartbeat will be listened to.  Any test results from the previous visit will be discussed. Your caregiver may ask you:  How you are feeling.  If you are feeling the baby move.  If you have had any abnormal symptoms, such as leaking fluid, bleeding, severe headaches, or abdominal cramping.  If you have any questions. Other tests that may be performed during your second trimester include:  Blood tests that check for:  Low iron levels (anemia).  Gestational diabetes (between 24 and 28 weeks).  Rh antibodies.  Urine tests to check for infections, diabetes, or protein in the urine.  An ultrasound to confirm the proper growth and development of the baby.  An amniocentesis to check for possible genetic problems.  Fetal screens for spina bifida and Down syndrome. HOME CARE INSTRUCTIONS   Avoid all smoking, herbs, alcohol, and unprescribed drugs. These chemicals affect the formation and growth of the baby.  Follow your caregiver's instructions regarding medicine use. There are medicines that are either safe or unsafe to take during pregnancy.  Exercise only as directed by your caregiver. Experiencing uterine cramps is a good sign to stop exercising.  Continue to eat regular,   healthy meals.  Wear a good support bra for breast tenderness.  Do not use hot tubs, steam rooms, or saunas.  Wear your seat belt at all times when driving.  Avoid raw meat, uncooked cheese, cat litter boxes, and soil used by cats. These carry germs that can cause birth defects in the baby.  Take your prenatal vitamins.  Try taking a stool softener (if your caregiver approves) if you develop constipation. Eat more high-fiber foods,  such as fresh vegetables or fruit and whole grains. Drink plenty of fluids to keep your urine clear or pale yellow.  Take warm sitz baths to soothe any pain or discomfort caused by hemorrhoids. Use hemorrhoid cream if your caregiver approves.  If you develop varicose veins, wear support hose. Elevate your feet for 15 minutes, 3 4 times a day. Limit salt in your diet.  Avoid heavy lifting, wear low heel shoes, and practice good posture.  Rest with your legs elevated if you have leg cramps or low back pain.  Visit your dentist if you have not gone yet during your pregnancy. Use a soft toothbrush to brush your teeth and be gentle when you floss.  A sexual relationship may be continued unless your caregiver directs you otherwise.  Continue to go to all your prenatal visits as directed by your caregiver. SEEK MEDICAL CARE IF:   You have dizziness.  You have mild pelvic cramps, pelvic pressure, or nagging pain in the abdominal area.  You have persistent nausea, vomiting, or diarrhea.  You have a bad smelling vaginal discharge.  You have pain with urination. SEEK IMMEDIATE MEDICAL CARE IF:   You have a fever.  You are leaking fluid from your vagina.  You have spotting or bleeding from your vagina.  You have severe abdominal cramping or pain.  You have rapid weight gain or loss.  You have shortness of breath with chest pain.  You notice sudden or extreme swelling of your face, hands, ankles, feet, or legs.  You have not felt your baby move in over an hour.  You have severe headaches that do not go away with medicine.  You have vision changes. Document Released: 07/06/2001 Document Revised: 03/14/2013 Document Reviewed: 09/12/2012 [employer] Patient Information 2014 [employer].  

## 2013-06-21 LAB — CULTURE, OB URINE: Colony Count: NO GROWTH

## 2013-07-17 ENCOUNTER — Ambulatory Visit (INDEPENDENT_AMBULATORY_CARE_PROVIDER_SITE_OTHER): Payer: MEDICAID | Admitting: Obstetrics & Gynecology

## 2013-07-17 ENCOUNTER — Encounter: Payer: Self-pay | Admitting: Obstetrics & Gynecology

## 2013-07-17 VITALS — BP 116/75 | Temp 97.0°F | Wt 182.3 lb

## 2013-07-17 DIAGNOSIS — Z3483 Encounter for supervision of other normal pregnancy, third trimester: Secondary | ICD-10-CM

## 2013-07-17 DIAGNOSIS — O09293 Supervision of pregnancy with other poor reproductive or obstetric history, third trimester: Secondary | ICD-10-CM

## 2013-07-17 DIAGNOSIS — O09299 Supervision of pregnancy with other poor reproductive or obstetric history, unspecified trimester: Secondary | ICD-10-CM | POA: Insufficient documentation

## 2013-07-17 LAB — CBC
Hemoglobin: 11.2 g/dL — ABNORMAL LOW (ref 12.0–15.0)
MCH: 30.4 pg (ref 26.0–34.0)
RBC: 3.69 MIL/uL — ABNORMAL LOW (ref 3.87–5.11)

## 2013-07-17 LAB — POCT URINALYSIS DIP (DEVICE)
Bilirubin Urine: NEGATIVE
Ketones, ur: NEGATIVE mg/dL
pH: 6 (ref 5.0–8.0)

## 2013-07-17 NOTE — Progress Notes (Signed)
Pelvic pressure

## 2013-07-17 NOTE — Patient Instructions (Signed)
Third Trimester of Pregnancy  The third trimester is from week 29 through week 42, months 7 through 9. The third trimester is a time when the fetus is growing rapidly. At the end of the ninth month, the fetus is about 20 inches in length and weighs 6 10 pounds.   BODY CHANGES  Your body goes through many changes during pregnancy. The changes vary from woman to woman.    Your weight will continue to increase. You can expect to gain 25 35 pounds (11 16 kg) by the end of the pregnancy.   You may begin to get stretch marks on your hips, abdomen, and breasts.   You may urinate more often because the fetus is moving lower into your pelvis and pressing on your bladder.   You may develop or continue to have heartburn as a result of your pregnancy.   You may develop constipation because certain hormones are causing the muscles that push waste through your intestines to slow down.   You may develop hemorrhoids or swollen, bulging veins (varicose veins).   You may have pelvic pain because of the weight gain and pregnancy hormones relaxing your joints between the bones in your pelvis. Back aches may result from over exertion of the muscles supporting your posture.   Your breasts will continue to grow and be tender. A yellow discharge may leak from your breasts called colostrum.   Your belly button may stick out.   You may feel short of breath because of your expanding uterus.   You may notice the fetus "dropping," or moving lower in your abdomen.   You may have a bloody mucus discharge. This usually occurs a few days to a week before labor begins.   Your cervix becomes thin and soft (effaced) near your due date.  WHAT TO EXPECT AT YOUR PRENATAL EXAMS   You will have prenatal exams every 2 weeks until week 36. Then, you will have weekly prenatal exams. During a routine prenatal visit:   You will be weighed to make sure you and the fetus are growing normally.   Your blood pressure is taken.   Your abdomen will be  measured to track your baby's growth.   The fetal heartbeat will be listened to.   Any test results from the previous visit will be discussed.   You may have a cervical check near your due date to see if you have effaced.  At around 36 weeks, your caregiver will check your cervix. At the same time, your caregiver will also perform a test on the secretions of the vaginal tissue. This test is to determine if a type of bacteria, Group B streptococcus, is present. Your caregiver will explain this further.  Your caregiver may ask you:   What your birth plan is.   How you are feeling.   If you are feeling the baby move.   If you have had any abnormal symptoms, such as leaking fluid, bleeding, severe headaches, or abdominal cramping.   If you have any questions.  Other tests or screenings that may be performed during your third trimester include:   Blood tests that check for low iron levels (anemia).   Fetal testing to check the health, activity level, and growth of the fetus. Testing is done if you have certain medical conditions or if there are problems during the pregnancy.  FALSE LABOR  You may feel small, irregular contractions that eventually go away. These are called Braxton Hicks contractions, or   false labor. Contractions may last for hours, days, or even weeks before true labor sets in. If contractions come at regular intervals, intensify, or become painful, it is best to be seen by your caregiver.   SIGNS OF LABOR    Menstrual-like cramps.   Contractions that are 5 minutes apart or less.   Contractions that start on the top of the uterus and spread down to the lower abdomen and back.   A sense of increased pelvic pressure or back pain.   A watery or bloody mucus discharge that comes from the vagina.  If you have any of these signs before the 37th week of pregnancy, call your caregiver right away. You need to go to the hospital to get checked immediately.  HOME CARE INSTRUCTIONS    Avoid all  smoking, herbs, alcohol, and unprescribed drugs. These chemicals affect the formation and growth of the baby.   Follow your caregiver's instructions regarding medicine use. There are medicines that are either safe or unsafe to take during pregnancy.   Exercise only as directed by your caregiver. Experiencing uterine cramps is a good sign to stop exercising.   Continue to eat regular, healthy meals.   Wear a good support bra for breast tenderness.   Do not use hot tubs, steam rooms, or saunas.   Wear your seat belt at all times when driving.   Avoid raw meat, uncooked cheese, cat litter boxes, and soil used by cats. These carry germs that can cause birth defects in the baby.   Take your prenatal vitamins.   Try taking a stool softener (if your caregiver approves) if you develop constipation. Eat more high-fiber foods, such as fresh vegetables or fruit and whole grains. Drink plenty of fluids to keep your urine clear or pale yellow.   Take warm sitz baths to soothe any pain or discomfort caused by hemorrhoids. Use hemorrhoid cream if your caregiver approves.   If you develop varicose veins, wear support hose. Elevate your feet for 15 minutes, 3 4 times a day. Limit salt in your diet.   Avoid heavy lifting, wear low heal shoes, and practice good posture.   Rest a lot with your legs elevated if you have leg cramps or low back pain.   Visit your dentist if you have not gone during your pregnancy. Use a soft toothbrush to brush your teeth and be gentle when you floss.   A sexual relationship may be continued unless your caregiver directs you otherwise.   Do not travel far distances unless it is absolutely necessary and only with the approval of your caregiver.   Take prenatal classes to understand, practice, and ask questions about the labor and delivery.   Make a trial run to the hospital.   Pack your hospital bag.   Prepare the baby's nursery.   Continue to go to all your prenatal visits as directed  by your caregiver.  SEEK MEDICAL CARE IF:   You are unsure if you are in labor or if your water has broken.   You have dizziness.   You have mild pelvic cramps, pelvic pressure, or nagging pain in your abdominal area.   You have persistent nausea, vomiting, or diarrhea.   You have a bad smelling vaginal discharge.   You have pain with urination.  SEEK IMMEDIATE MEDICAL CARE IF:    You have a fever.   You are leaking fluid from your vagina.   You have spotting or bleeding from your vagina.     You have severe abdominal cramping or pain.   You have rapid weight loss or gain.   You have shortness of breath with chest pain.   You notice sudden or extreme swelling of your face, hands, ankles, feet, or legs.   You have not felt your baby move in over an hour.   You have severe headaches that do not go away with medicine.   You have vision changes.  Document Released: 07/06/2001 Document Revised: 03/14/2013 Document Reviewed: 09/12/2012  [employer] Patient Information 2014 [employer].

## 2013-07-17 NOTE — Progress Notes (Signed)
P= 98 C/o of intermittent lower abdominal pressure.  1hr gtt today.

## 2013-07-18 LAB — RPR

## 2013-07-18 LAB — HIV ANTIBODY (ROUTINE TESTING W REFLEX): HIV: NONREACTIVE

## 2013-07-18 LAB — GLUCOSE TOLERANCE, 1 HOUR (50G) W/O FASTING: Glucose, 1 Hour GTT: 87 mg/dL (ref 70–140)

## 2013-07-26 NOTE — L&D Delivery Note (Signed)
Attestation of Attending Supervision of Obstetric Fellow: Evaluation and management procedures were performed by the Obstetric Fellow under my supervision and collaboration.  I have reviewed the Obstetric Fellow's note and chart, and I agree with the management and plan.  Zahid Carneiro, MD, FACOG Attending Obstetrician & Gynecologist Faculty Practice, [facility]    
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
## 2013-08-07 ENCOUNTER — Ambulatory Visit (INDEPENDENT_AMBULATORY_CARE_PROVIDER_SITE_OTHER): Payer: Medicaid Other | Admitting: Family

## 2013-08-07 VITALS — BP 110/67 | Temp 98.7°F | Wt 187.7 lb

## 2013-08-07 DIAGNOSIS — Z3480 Encounter for supervision of other normal pregnancy, unspecified trimester: Secondary | ICD-10-CM

## 2013-08-07 DIAGNOSIS — O09299 Supervision of pregnancy with other poor reproductive or obstetric history, unspecified trimester: Secondary | ICD-10-CM

## 2013-08-07 LAB — POCT URINALYSIS DIP (DEVICE)
Bilirubin Urine: NEGATIVE
Glucose, UA: NEGATIVE mg/dL
Hgb urine dipstick: NEGATIVE
LEUKOCYTES UA: NEGATIVE
Nitrite: NEGATIVE
PROTEIN: NEGATIVE mg/dL
Specific Gravity, Urine: >=1.03 (ref 1.005–1.030)
Urobilinogen, UA: 0.2 mg/dL (ref 0.0–1.0)
pH: 5.5 (ref 5.0–8.0)

## 2013-08-07 NOTE — Progress Notes (Signed)
P-100 

## 2013-08-07 NOTE — Progress Notes (Signed)
Reviewed GDM screen; reports episode of diarrhea 4 days ago.  No report of fever, symptoms resolved.  Reviewed warning signs of pregnancy.

## 2013-08-21 ENCOUNTER — Encounter: Payer: Self-pay | Admitting: Obstetrics and Gynecology

## 2013-08-21 ENCOUNTER — Ambulatory Visit (INDEPENDENT_AMBULATORY_CARE_PROVIDER_SITE_OTHER): Payer: Medicaid Other | Admitting: Obstetrics and Gynecology

## 2013-08-21 VITALS — BP 116/72 | Temp 97.1°F | Wt 192.8 lb

## 2013-08-21 DIAGNOSIS — Z3480 Encounter for supervision of other normal pregnancy, unspecified trimester: Secondary | ICD-10-CM

## 2013-08-21 LAB — POCT URINALYSIS DIP (DEVICE)
Bilirubin Urine: NEGATIVE
Glucose, UA: NEGATIVE mg/dL
Hgb urine dipstick: NEGATIVE
Ketones, ur: NEGATIVE mg/dL
Nitrite: NEGATIVE
PROTEIN: NEGATIVE mg/dL
Urobilinogen, UA: 0.2 mg/dL (ref 0.0–1.0)
pH: 6 (ref 5.0–8.0)

## 2013-08-21 NOTE — Patient Instructions (Signed)
Third Trimester of Pregnancy  The third trimester is from week 29 through week 42, months 7 through 9. The third trimester is a time when the fetus is growing rapidly. At the end of the ninth month, the fetus is about 20 inches in length and weighs 6 10 pounds.   BODY CHANGES  Your body goes through many changes during pregnancy. The changes vary from woman to woman.    Your weight will continue to increase. You can expect to gain 25 35 pounds (11 16 kg) by the end of the pregnancy.   You may begin to get stretch marks on your hips, abdomen, and breasts.   You may urinate more often because the fetus is moving lower into your pelvis and pressing on your bladder.   You may develop or continue to have heartburn as a result of your pregnancy.   You may develop constipation because certain hormones are causing the muscles that push waste through your intestines to slow down.   You may develop hemorrhoids or swollen, bulging veins (varicose veins).   You may have pelvic pain because of the weight gain and pregnancy hormones relaxing your joints between the bones in your pelvis. Back aches may result from over exertion of the muscles supporting your posture.   Your breasts will continue to grow and be tender. A yellow discharge may leak from your breasts called colostrum.   Your belly button may stick out.   You may feel short of breath because of your expanding uterus.   You may notice the fetus "dropping," or moving lower in your abdomen.   You may have a bloody mucus discharge. This usually occurs a few days to a week before labor begins.   Your cervix becomes thin and soft (effaced) near your due date.  WHAT TO EXPECT AT YOUR PRENATAL EXAMS   You will have prenatal exams every 2 weeks until week 36. Then, you will have weekly prenatal exams. During a routine prenatal visit:   You will be weighed to make sure you and the fetus are growing normally.   Your blood pressure is taken.   Your abdomen will be  measured to track your baby's growth.   The fetal heartbeat will be listened to.   Any test results from the previous visit will be discussed.   You may have a cervical check near your due date to see if you have effaced.  At around 36 weeks, your caregiver will check your cervix. At the same time, your caregiver will also perform a test on the secretions of the vaginal tissue. This test is to determine if a type of bacteria, Group B streptococcus, is present. Your caregiver will explain this further.  Your caregiver may ask you:   What your birth plan is.   How you are feeling.   If you are feeling the baby move.   If you have had any abnormal symptoms, such as leaking fluid, bleeding, severe headaches, or abdominal cramping.   If you have any questions.  Other tests or screenings that may be performed during your third trimester include:   Blood tests that check for low iron levels (anemia).   Fetal testing to check the health, activity level, and growth of the fetus. Testing is done if you have certain medical conditions or if there are problems during the pregnancy.  FALSE LABOR  You may feel small, irregular contractions that eventually go away. These are called Braxton Hicks contractions, or   false labor. Contractions may last for hours, days, or even weeks before true labor sets in. If contractions come at regular intervals, intensify, or become painful, it is best to be seen by your caregiver.   SIGNS OF LABOR    Menstrual-like cramps.   Contractions that are 5 minutes apart or less.   Contractions that start on the top of the uterus and spread down to the lower abdomen and back.   A sense of increased pelvic pressure or back pain.   A watery or bloody mucus discharge that comes from the vagina.  If you have any of these signs before the 37th week of pregnancy, call your caregiver right away. You need to go to the hospital to get checked immediately.  HOME CARE INSTRUCTIONS    Avoid all  smoking, herbs, alcohol, and unprescribed drugs. These chemicals affect the formation and growth of the baby.   Follow your caregiver's instructions regarding medicine use. There are medicines that are either safe or unsafe to take during pregnancy.   Exercise only as directed by your caregiver. Experiencing uterine cramps is a good sign to stop exercising.   Continue to eat regular, healthy meals.   Wear a good support bra for breast tenderness.   Do not use hot tubs, steam rooms, or saunas.   Wear your seat belt at all times when driving.   Avoid raw meat, uncooked cheese, cat litter boxes, and soil used by cats. These carry germs that can cause birth defects in the baby.   Take your prenatal vitamins.   Try taking a stool softener (if your caregiver approves) if you develop constipation. Eat more high-fiber foods, such as fresh vegetables or fruit and whole grains. Drink plenty of fluids to keep your urine clear or pale yellow.   Take warm sitz baths to soothe any pain or discomfort caused by hemorrhoids. Use hemorrhoid cream if your caregiver approves.   If you develop varicose veins, wear support hose. Elevate your feet for 15 minutes, 3 4 times a day. Limit salt in your diet.   Avoid heavy lifting, wear low heal shoes, and practice good posture.   Rest a lot with your legs elevated if you have leg cramps or low back pain.   Visit your dentist if you have not gone during your pregnancy. Use a soft toothbrush to brush your teeth and be gentle when you floss.   A sexual relationship may be continued unless your caregiver directs you otherwise.   Do not travel far distances unless it is absolutely necessary and only with the approval of your caregiver.   Take prenatal classes to understand, practice, and ask questions about the labor and delivery.   Make a trial run to the hospital.   Pack your hospital bag.   Prepare the baby's nursery.   Continue to go to all your prenatal visits as directed  by your caregiver.  SEEK MEDICAL CARE IF:   You are unsure if you are in labor or if your water has broken.   You have dizziness.   You have mild pelvic cramps, pelvic pressure, or nagging pain in your abdominal area.   You have persistent nausea, vomiting, or diarrhea.   You have a bad smelling vaginal discharge.   You have pain with urination.  SEEK IMMEDIATE MEDICAL CARE IF:    You have a fever.   You are leaking fluid from your vagina.   You have spotting or bleeding from your vagina.     You have severe abdominal cramping or pain.   You have rapid weight loss or gain.   You have shortness of breath with chest pain.   You notice sudden or extreme swelling of your face, hands, ankles, feet, or legs.   You have not felt your baby move in over an hour.   You have severe headaches that do not go away with medicine.   You have vision changes.  Document Released: 07/06/2001 Document Revised: 03/14/2013 Document Reviewed: 09/12/2012  [employer] Patient Information 2014 [employer].

## 2013-08-21 NOTE — Progress Notes (Signed)
RLP discussed. Feels well. No concerns.

## 2013-08-21 NOTE — Progress Notes (Signed)
p=101 

## 2013-08-24 ENCOUNTER — Observation Stay (HOSPITAL_COMMUNITY)
Admission: EM | Admit: 2013-08-24 | Discharge: 2013-08-25 | Disposition: A | Discharge status: Home / Self Care | Payer: Medicaid Other | Attending: Obstetrics & Gynecology | Admitting: Obstetrics & Gynecology

## 2013-08-24 ENCOUNTER — Encounter (HOSPITAL_COMMUNITY): Payer: Self-pay | Admitting: Emergency Medicine

## 2013-08-24 DIAGNOSIS — Z87891 Personal history of nicotine dependence: Secondary | ICD-10-CM

## 2013-08-24 DIAGNOSIS — W2203XA Walked into furniture, initial encounter: Secondary | ICD-10-CM | POA: Insufficient documentation

## 2013-08-24 DIAGNOSIS — R109 Unspecified abdominal pain: Secondary | ICD-10-CM | POA: Diagnosis present

## 2013-08-24 DIAGNOSIS — O99891 Other specified diseases and conditions complicating pregnancy: Secondary | ICD-10-CM

## 2013-08-24 DIAGNOSIS — O9989 Other specified diseases and conditions complicating pregnancy, childbirth and the puerperium: Secondary | ICD-10-CM

## 2013-08-24 DIAGNOSIS — Z3490 Encounter for supervision of normal pregnancy, unspecified, unspecified trimester: Secondary | ICD-10-CM

## 2013-08-24 DIAGNOSIS — S3991XA Unspecified injury of abdomen, initial encounter: Secondary | ICD-10-CM | POA: Diagnosis present

## 2013-08-24 DIAGNOSIS — Y92009 Unspecified place in unspecified non-institutional (private) residence as the place of occurrence of the external cause: Secondary | ICD-10-CM | POA: Insufficient documentation

## 2013-08-24 LAB — FETAL FIBRONECTIN: Fetal Fibronectin: NEGATIVE

## 2013-08-24 MED ORDER — CALCIUM CARBONATE ANTACID 500 MG PO CHEW: 2.0000 | CHEWABLE_TABLET | ORAL | Status: DC | PRN

## 2013-08-24 MED ORDER — SODIUM CHLORIDE 0.9 % IV SOLN: INTRAVENOUS | Status: DC

## 2013-08-24 MED ORDER — ZOLPIDEM TARTRATE 5 MG PO TABS: 5.0000 mg | ORAL_TABLET | Freq: Every evening | ORAL | Status: DC | PRN

## 2013-08-24 MED ORDER — ACETAMINOPHEN 325 MG PO TABS: 650.0000 mg | ORAL_TABLET | ORAL | Status: DC | PRN

## 2013-08-24 MED ORDER — PRENATAL MULTIVITAMIN CH
1.0000 | ORAL_TABLET | Freq: Every day | ORAL | Status: DC
  Administered 2013-08-25: 1 via ORAL
  Filled 2013-08-24: qty 1

## 2013-08-24 MED ORDER — DOCUSATE SODIUM 100 MG PO CAPS
100.0000 mg | ORAL_CAPSULE | Freq: Every day | ORAL | Status: DC
  Filled 2013-08-24: qty 1

## 2013-08-24 MED ORDER — LACTATED RINGERS IV SOLN
INTRAVENOUS | Status: DC
  Administered 2013-08-24 – 2013-08-25 (×2): via INTRAVENOUS

## 2013-08-24 MED ORDER — SODIUM CHLORIDE 0.9 % IV BOLUS (SEPSIS)
1000.0000 mL | Freq: Once | INTRAVENOUS | Status: AC
  Administered 2013-08-24: 1000 mL via INTRAVENOUS
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
## 2013-08-24 NOTE — ED Notes (Signed)
Notified [facility] for transport

## 2013-08-24 NOTE — ED Notes (Signed)
Rapid response OB RN at bedside.  
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
## 2013-08-24 NOTE — ED Notes (Signed)
Dr. Patria Maneampos at bedside doing ultrasound. Paged rapid response OB RN. FHR 143 per ultrasound.
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
## 2013-08-24 NOTE — ED Notes (Signed)
Rapid response RN [facility].
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
## 2013-08-24 NOTE — ED Notes (Signed)
Notified OB Rapid Reponse 

## 2013-08-25 NOTE — Progress Notes (Signed)
Utilization review completed.  P.J. Decklyn Hornik,RN,BSN Case Manager [phone]  
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
## 2013-08-25 NOTE — Discharge Summary (Signed)
Attestation of Attending Supervision of Advanced Practitioner (CNM/NP): Evaluation and management procedures were performed by the Advanced Practitioner under my supervision and collaboration.  I have reviewed the Advanced Practitioner's note and chart, and I agree with the management and plan.  Macee Venables 08/25/2013 6:58 PM
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
## 2013-08-29 NOTE — H&P (Signed)
Pt seen and examined.  Attestation of Attending Supervision of Advanced Practitioner (CNM/NP): Evaluation and management procedures were performed by the Advanced Practitioner under my supervision and collaboration. I have reviewed the Advanced Practitioner's note and chart, and I agree with the management and plan.  [name] [name] H. 2:16 PM

## 2013-09-04 ENCOUNTER — Ambulatory Visit (INDEPENDENT_AMBULATORY_CARE_PROVIDER_SITE_OTHER): Payer: Medicaid Other | Admitting: Advanced Practice Midwife

## 2013-09-04 VITALS — BP 123/72 | Temp 97.1°F | Wt 196.8 lb

## 2013-09-04 DIAGNOSIS — Z3480 Encounter for supervision of other normal pregnancy, unspecified trimester: Secondary | ICD-10-CM

## 2013-09-04 DIAGNOSIS — O09299 Supervision of pregnancy with other poor reproductive or obstetric history, unspecified trimester: Secondary | ICD-10-CM

## 2013-09-04 LAB — POCT URINALYSIS DIP (DEVICE)
Bilirubin Urine: NEGATIVE
Glucose, UA: NEGATIVE mg/dL
HGB URINE DIPSTICK: NEGATIVE
Ketones, ur: NEGATIVE mg/dL
NITRITE: NEGATIVE
PH: 6.5 (ref 5.0–8.0)
Protein, ur: NEGATIVE mg/dL
SPECIFIC GRAVITY, URINE: 1.025 (ref 1.005–1.030)
UROBILINOGEN UA: 0.2 mg/dL (ref 0.0–1.0)

## 2013-09-04 NOTE — Progress Notes (Signed)
Pulse- 88 Pt reports having braxton hicks contractions x 1

## 2013-09-04 NOTE — Progress Notes (Signed)
Doing well.  Good fetal movement, denies vaginal bleeding, LOF, regular contractions.  Had URI this week, does not want medication.  Encourage PO fluids, saline nasal spray.

## 2013-09-24 ENCOUNTER — Inpatient Hospital Stay (HOSPITAL_COMMUNITY)
Admission: AD | Admit: 2013-09-24 | Discharge: 2013-09-24 | Disposition: A | Discharge status: Home / Self Care | Payer: Medicaid Other | Source: Ambulatory Visit | Attending: Obstetrics & Gynecology | Admitting: Obstetrics & Gynecology

## 2013-09-24 ENCOUNTER — Encounter (HOSPITAL_COMMUNITY): Payer: Self-pay | Admitting: *Deleted

## 2013-09-24 DIAGNOSIS — O99891 Other specified diseases and conditions complicating pregnancy: Secondary | ICD-10-CM | POA: Insufficient documentation

## 2013-09-24 DIAGNOSIS — N949 Unspecified condition associated with female genital organs and menstrual cycle: Secondary | ICD-10-CM

## 2013-09-24 DIAGNOSIS — R109 Unspecified abdominal pain: Secondary | ICD-10-CM | POA: Insufficient documentation

## 2013-09-24 DIAGNOSIS — M549 Dorsalgia, unspecified: Secondary | ICD-10-CM | POA: Insufficient documentation

## 2013-09-24 DIAGNOSIS — Z87891 Personal history of nicotine dependence: Secondary | ICD-10-CM | POA: Insufficient documentation

## 2013-09-24 DIAGNOSIS — O479 False labor, unspecified: Secondary | ICD-10-CM | POA: Insufficient documentation

## 2013-09-24 DIAGNOSIS — O9989 Other specified diseases and conditions complicating pregnancy, childbirth and the puerperium: Principal | ICD-10-CM

## 2013-09-24 LAB — URINALYSIS, ROUTINE W REFLEX MICROSCOPIC
BILIRUBIN URINE: NEGATIVE
GLUCOSE, UA: NEGATIVE mg/dL
Hgb urine dipstick: NEGATIVE
Ketones, ur: NEGATIVE mg/dL
Leukocytes, UA: NEGATIVE
Nitrite: NEGATIVE
Protein, ur: NEGATIVE mg/dL
Specific Gravity, Urine: 1.025 (ref 1.005–1.030)
UROBILINOGEN UA: 0.2 mg/dL (ref 0.0–1.0)
pH: 6 (ref 5.0–8.0)

## 2013-09-24 LAB — OB RESULTS CONSOLE GBS: GBS: NEGATIVE

## 2013-09-24 LAB — GROUP B STREP BY PCR: Group B strep by PCR: NEGATIVE

## 2013-09-24 NOTE — Discharge Instructions (Signed)
Third Trimester of Pregnancy  The third trimester is from week 29 through week 42, months 7 through 9. The third trimester is a time when the fetus is growing rapidly. At the end of the ninth month, the fetus is about 20 inches in length and weighs 6 10 pounds.   BODY CHANGES  Your body goes through many changes during pregnancy. The changes vary from woman to woman.    Your weight will continue to increase. You can expect to gain 25 35 pounds (11 16 kg) by the end of the pregnancy.   You may begin to get stretch marks on your hips, abdomen, and breasts.   You may urinate more often because the fetus is moving lower into your pelvis and pressing on your bladder.   You may develop or continue to have heartburn as a result of your pregnancy.   You may develop constipation because certain hormones are causing the muscles that push waste through your intestines to slow down.   You may develop hemorrhoids or swollen, bulging veins (varicose veins).   You may have pelvic pain because of the weight gain and pregnancy hormones relaxing your joints between the bones in your pelvis. Back aches may result from over exertion of the muscles supporting your posture.   Your breasts will continue to grow and be tender. A yellow discharge may leak from your breasts called colostrum.   Your belly button may stick out.   You may feel short of breath because of your expanding uterus.   You may notice the fetus "dropping," or moving lower in your abdomen.   You may have a bloody mucus discharge. This usually occurs a few days to a week before labor begins.   Your cervix becomes thin and soft (effaced) near your due date.  WHAT TO EXPECT AT YOUR PRENATAL EXAMS   You will have prenatal exams every 2 weeks until week 36. Then, you will have weekly prenatal exams. During a routine prenatal visit:   You will be weighed to make sure you and the fetus are growing normally.   Your blood pressure is taken.   Your abdomen will be  measured to track your baby's growth.   The fetal heartbeat will be listened to.   Any test results from the previous visit will be discussed.   You may have a cervical check near your due date to see if you have effaced.  At around 36 weeks, your caregiver will check your cervix. At the same time, your caregiver will also perform a test on the secretions of the vaginal tissue. This test is to determine if a type of bacteria, Group B streptococcus, is present. Your caregiver will explain this further.  Your caregiver may ask you:   What your birth plan is.   How you are feeling.   If you are feeling the baby move.   If you have had any abnormal symptoms, such as leaking fluid, bleeding, severe headaches, or abdominal cramping.   If you have any questions.  Other tests or screenings that may be performed during your third trimester include:   Blood tests that check for low iron levels (anemia).   Fetal testing to check the health, activity level, and growth of the fetus. Testing is done if you have certain medical conditions or if there are problems during the pregnancy.  FALSE LABOR  You may feel small, irregular contractions that eventually go away. These are called Braxton Hicks contractions, or   false labor. Contractions may last for hours, days, or even weeks before true labor sets in. If contractions come at regular intervals, intensify, or become painful, it is best to be seen by your caregiver.   SIGNS OF LABOR    Menstrual-like cramps.   Contractions that are 5 minutes apart or less.   Contractions that start on the top of the uterus and spread down to the lower abdomen and back.   A sense of increased pelvic pressure or back pain.   A watery or bloody mucus discharge that comes from the vagina.  If you have any of these signs before the 37th week of pregnancy, call your caregiver right away. You need to go to the hospital to get checked immediately.  HOME CARE INSTRUCTIONS    Avoid all  smoking, herbs, alcohol, and unprescribed drugs. These chemicals affect the formation and growth of the baby.   Follow your caregiver's instructions regarding medicine use. There are medicines that are either safe or unsafe to take during pregnancy.   Exercise only as directed by your caregiver. Experiencing uterine cramps is a good sign to stop exercising.   Continue to eat regular, healthy meals.   Wear a good support bra for breast tenderness.   Do not use hot tubs, steam rooms, or saunas.   Wear your seat belt at all times when driving.   Avoid raw meat, uncooked cheese, cat litter boxes, and soil used by cats. These carry germs that can cause birth defects in the baby.   Take your prenatal vitamins.   Try taking a stool softener (if your caregiver approves) if you develop constipation. Eat more high-fiber foods, such as fresh vegetables or fruit and whole grains. Drink plenty of fluids to keep your urine clear or pale yellow.   Take warm sitz baths to soothe any pain or discomfort caused by hemorrhoids. Use hemorrhoid cream if your caregiver approves.   If you develop varicose veins, wear support hose. Elevate your feet for 15 minutes, 3 4 times a day. Limit salt in your diet.   Avoid heavy lifting, wear low heal shoes, and practice good posture.   Rest a lot with your legs elevated if you have leg cramps or low back pain.   Visit your dentist if you have not gone during your pregnancy. Use a soft toothbrush to brush your teeth and be gentle when you floss.   A sexual relationship may be continued unless your caregiver directs you otherwise.   Do not travel far distances unless it is absolutely necessary and only with the approval of your caregiver.   Take prenatal classes to understand, practice, and ask questions about the labor and delivery.   Make a trial run to the hospital.   Pack your hospital bag.   Prepare the baby's nursery.   Continue to go to all your prenatal visits as directed  by your caregiver.  SEEK MEDICAL CARE IF:   You are unsure if you are in labor or if your water has broken.   You have dizziness.   You have mild pelvic cramps, pelvic pressure, or nagging pain in your abdominal area.   You have persistent nausea, vomiting, or diarrhea.   You have a bad smelling vaginal discharge.   You have pain with urination.  SEEK IMMEDIATE MEDICAL CARE IF:    You have a fever.   You are leaking fluid from your vagina.   You have spotting or bleeding from your vagina.     You have severe abdominal cramping or pain.   You have rapid weight loss or gain.   You have shortness of breath with chest pain.   You notice sudden or extreme swelling of your face, hands, ankles, feet, or legs.   You have not felt your baby move in over an hour.   You have severe headaches that do not go away with medicine.   You have vision changes.  Document Released: 07/06/2001 Document Revised: 03/14/2013 Document Reviewed: 09/12/2012  Exit[employer] Patient Information 2014 [employer].

## 2013-09-24 NOTE — MAU Note (Signed)
Patient states she has been having constant pelvic pain for a couple of weeks, states she started having low back pain over the past 2-3 days. Is hard to walk. Denies bleeding or leaking but does have an increase in vaginal discharge. Reports fetal movement.

## 2013-09-24 NOTE — MAU Provider Note (Signed)
Attestation of Attending Supervision of Advanced Practitioner (CNM/NP): Evaluation and management procedures were performed by the Advanced Practitioner under my supervision and collaboration.  I have reviewed the Advanced Practitioner's note and chart, and I agree with the management and plan.  HARRAWAY-SMITH, Levert Heslop 7:46 PM     

## 2013-09-24 NOTE — MAU Provider Note (Signed)
Chief Complaint:  Back Pain and Pelvic Pain   First Provider Initiated Contact with Patient 09/24/13 1856      HPI: [name] [name] is a 23 y.o. G2P1001 at [redacted]w[redacted]d who presents to maternity admissions reporting BIlateral groin pain and pain over SP bone with movemnet and LBP constantly. Denies dysuria, urgency frequency.  Denies contractions, leakage of fluid or vaginal bleeding. Good fetal movement.   Pregnancy Course: LRC. Essentially uncomplicated. Missed last appointment   Past Medical History: Past Medical History  Diagnosis Date  . Breast feeding status of mother   . Migraine     Past obstetric history: OB History  Gravida Para Term Preterm AB SAB TAB Ectopic Multiple Living  2 1 1  0 0 0 0 0 0 1    # Outcome Date GA Lbr Len/2nd Weight Sex Delivery Anes PTL Lv  2 CUR           1 TRM 08/13/11 [redacted]w[redacted]d  4.338 kg (9 lb 9 oz) F SVD None  Y     Comments: LGA 9#9oz, no GDM or SD. Vacuum assist, episiotomy      Past Surgical History: Past Surgical History  Procedure Laterality Date  . Tonsillectomy       Family History: Family History  Problem Relation Age of Onset  . Cancer Maternal Grandmother   . Hypertension Paternal Grandmother   . Diabetes Paternal Grandmother     Social History: History  Substance Use Topics  . Smoking status: Former Games developer  . Smokeless tobacco: Never Used  . Alcohol Use: No    Allergies: No Known Allergies  Meds:  Prescriptions prior to admission  Medication Sig Dispense Refill  . Prenatal Vit-Fe Fumarate-FA (PRENATAL MULTIVITAMIN) TABS Take 1 tablet by mouth every evening.         ROS: Pertinent findings in history of present illness.  Physical Exam  Blood pressure 114/62, pulse 79, temperature 97.6 F (36.4 C), temperature source Oral, resp. rate 16, height 5\' 4"  (1.626 m), last menstrual period 01/01/2013, SpO2 97.00%. GENERAL: Well-developed, well-nourished female in no acute distress.  HEENT: normocephalic HEART: normal  rate RESP: normal effort BACK: neg CVAT ABDOMEN: Soft, mildly tender groin and over symphysis, gravid appropriate for gestational age EXTREMITIES: Nontender, no edema NEURO: alert and oriented SPECULUM EXAM: NEFG, physiologic discharge, no blood, cervix clean Dilation: 1 Effacement (%): Thick Cervical Position: Posterior Station: -3 Presentation: Vertex Exam by:: D. Kali Deadwyler CNM  FHT:  Baseline 115-120 , moderate variability, accelerations present, no decelerations Contractions: occasional mild   Labs: Results for orders placed during the hospital encounter of 09/24/13 (from the past 24 hour(s))  URINALYSIS, ROUTINE W REFLEX MICROSCOPIC     Status: Abnormal   Collection Time    09/24/13  5:10 PM      Result Value Ref Range   Color, Urine YELLOW  YELLOW   APPearance HAZY (*) CLEAR   Specific Gravity, Urine 1.025  1.005 - 1.030   pH 6.0  5.0 - 8.0   Glucose, UA NEGATIVE  NEGATIVE mg/dL   Hgb urine dipstick NEGATIVE  NEGATIVE   Bilirubin Urine NEGATIVE  NEGATIVE   Ketones, ur NEGATIVE  NEGATIVE mg/dL   Protein, ur NEGATIVE  NEGATIVE mg/dL   Urobilinogen, UA 0.2  0.0 - 1.0 mg/dL   Nitrite NEGATIVE  NEGATIVE   Leukocytes, UA NEGATIVE  NEGATIVE    Imaging:  No results found. MAU Course:  GBS done Assessment: 1. Pain of round ligament   G2P1001 at [redacted]w[redacted]d  Plan: Discharge home Labor precautions and fetal kick counts Reassurance given and general relief measures reviewed including avoidance of             precipitating movements, instructions on abdominal tightening/pelvic rock exercises, abdominal binder, rest with hip flexion. Acetaminophen for pain prn, not to exceed 4gm/24 hr Follow-up Information   Follow up with [facility] In 1 week.   Contact information:   [address] [place] [id number]        Medication List         prenatal multivitamin Tabs tablet  Take 1 tablet by mouth every evening.        Danae Orleanseirdre C Tammala Weider, CNM 09/24/2013 7:11  PM

## 2013-10-02 ENCOUNTER — Encounter: Payer: Self-pay | Admitting: Advanced Practice Midwife

## 2013-10-02 ENCOUNTER — Ambulatory Visit (INDEPENDENT_AMBULATORY_CARE_PROVIDER_SITE_OTHER): Payer: Medicaid Other | Admitting: Advanced Practice Midwife

## 2013-10-02 VITALS — BP 125/78 | Temp 97.8°F | Wt 204.4 lb

## 2013-10-02 DIAGNOSIS — O09299 Supervision of pregnancy with other poor reproductive or obstetric history, unspecified trimester: Secondary | ICD-10-CM

## 2013-10-02 LAB — OB RESULTS CONSOLE GC/CHLAMYDIA
Chlamydia: NEGATIVE
GC PROBE AMP, GENITAL: NEGATIVE

## 2013-10-02 LAB — POCT URINALYSIS DIP (DEVICE)
Bilirubin Urine: NEGATIVE
GLUCOSE, UA: NEGATIVE mg/dL
KETONES UR: NEGATIVE mg/dL
Nitrite: NEGATIVE
Protein, ur: NEGATIVE mg/dL
Specific Gravity, Urine: 1.025 (ref 1.005–1.030)
UROBILINOGEN UA: 0.2 mg/dL (ref 0.0–1.0)
pH: 6 (ref 5.0–8.0)

## 2013-10-02 NOTE — Progress Notes (Signed)
GBS done in MAU, Negative. Cultures on urine today. Cervix was 1cm last week. Now I can insert 2 fingers, about 1-2cm/tight 2.  Membranes swept per request. States worked last time. [facility] due to hx of macrosomia/vacuum.  I told her we would discuss at next visit, will consult MD, but that favorability of cervix would be biggest factor.

## 2013-10-02 NOTE — Patient Instructions (Signed)

## 2013-10-02 NOTE — Progress Notes (Signed)
Pulse- 91  Edema-hands/feet  Pain/pressure-pelvic and lower back GBS was obtained in MAU on 09/24/13

## 2013-10-03 ENCOUNTER — Encounter (HOSPITAL_COMMUNITY): Payer: Self-pay | Admitting: *Deleted

## 2013-10-03 ENCOUNTER — Inpatient Hospital Stay (HOSPITAL_COMMUNITY)
Admission: AD | Admit: 2013-10-03 | Discharge: 2013-10-05 | DRG: 775 | Disposition: A | Discharge status: Home / Self Care | Payer: Medicaid Other | Source: Ambulatory Visit | Attending: Obstetrics and Gynecology | Admitting: Obstetrics and Gynecology

## 2013-10-03 DIAGNOSIS — D696 Thrombocytopenia, unspecified: Secondary | ICD-10-CM | POA: Diagnosis present

## 2013-10-03 DIAGNOSIS — D689 Coagulation defect, unspecified: Secondary | ICD-10-CM | POA: Diagnosis present

## 2013-10-03 DIAGNOSIS — D649 Anemia, unspecified: Secondary | ICD-10-CM | POA: Diagnosis present

## 2013-10-03 DIAGNOSIS — O3660X0 Maternal care for excessive fetal growth, unspecified trimester, not applicable or unspecified: Secondary | ICD-10-CM | POA: Diagnosis present

## 2013-10-03 DIAGNOSIS — O9902 Anemia complicating childbirth: Secondary | ICD-10-CM | POA: Diagnosis present

## 2013-10-03 DIAGNOSIS — E669 Obesity, unspecified: Secondary | ICD-10-CM | POA: Diagnosis present

## 2013-10-03 DIAGNOSIS — O479 False labor, unspecified: Secondary | ICD-10-CM

## 2013-10-03 DIAGNOSIS — O99214 Obesity complicating childbirth: Secondary | ICD-10-CM

## 2013-10-03 DIAGNOSIS — IMO0001 Reserved for inherently not codable concepts without codable children: Secondary | ICD-10-CM

## 2013-10-03 DIAGNOSIS — O9912 Other diseases of the blood and blood-forming organs and certain disorders involving the immune mechanism complicating childbirth: Secondary | ICD-10-CM

## 2013-10-03 LAB — GC/CHLAMYDIA PROBE AMP
CT PROBE, AMP APTIMA: NEGATIVE
GC Probe RNA: NEGATIVE

## 2013-10-03 LAB — OB RESULTS CONSOLE GBS: GBS: NEGATIVE

## 2013-10-03 MED ORDER — LACTATED RINGERS IV SOLN
500.0000 mL | INTRAVENOUS | Status: DC | PRN
  Administered 2013-10-04: 500 mL via INTRAVENOUS

## 2013-10-03 MED ORDER — ACETAMINOPHEN 325 MG PO TABS
650.0000 mg | ORAL_TABLET | ORAL | Status: DC | PRN
Start: 2013-10-03 — End: 2013-10-04

## 2013-10-03 MED ORDER — FENTANYL CITRATE 0.05 MG/ML IJ SOLN
100.0000 ug | INTRAMUSCULAR | Status: DC | PRN
  Administered 2013-10-04: 100 ug via INTRAVENOUS
  Filled 2013-10-03: qty 2

## 2013-10-03 MED ORDER — LIDOCAINE HCL (PF) 1 % IJ SOLN
30.0000 mL | INTRAMUSCULAR | Status: DC | PRN
  Filled 2013-10-03: qty 30

## 2013-10-03 MED ORDER — OXYCODONE-ACETAMINOPHEN 5-325 MG PO TABS: 1.0000 | ORAL_TABLET | ORAL | Status: DC | PRN

## 2013-10-03 MED ORDER — OXYTOCIN 40 UNITS IN LACTATED RINGERS INFUSION - SIMPLE MED
62.5000 mL/h | INTRAVENOUS | Status: DC
Start: 2013-10-03 — End: 2013-10-04
  Filled 2013-10-03: qty 1000

## 2013-10-03 MED ORDER — OXYTOCIN BOLUS FROM INFUSION: 500.0000 mL | INTRAVENOUS | Status: DC

## 2013-10-03 MED ORDER — PROMETHAZINE HCL 25 MG/ML IJ SOLN
12.5000 mg | Freq: Once | INTRAMUSCULAR | Status: AC
  Administered 2013-10-03: 12.5 mg via INTRAMUSCULAR
  Filled 2013-10-03: qty 1

## 2013-10-03 MED ORDER — IBUPROFEN 600 MG PO TABS
600.0000 mg | ORAL_TABLET | Freq: Four times a day (QID) | ORAL | Status: DC | PRN
  Administered 2013-10-04: 600 mg via ORAL
  Filled 2013-10-03: qty 1

## 2013-10-03 MED ORDER — LACTATED RINGERS IV SOLN
INTRAVENOUS | Status: DC
  Administered 2013-10-04 (×3): via INTRAVENOUS

## 2013-10-03 MED ORDER — FENTANYL CITRATE 0.05 MG/ML IJ SOLN
100.0000 ug | Freq: Once | INTRAMUSCULAR | Status: DC
  Filled 2013-10-03: qty 2

## 2013-10-03 MED ORDER — FENTANYL CITRATE 0.05 MG/ML IJ SOLN
100.0000 ug | Freq: Once | INTRAMUSCULAR | Status: AC
  Administered 2013-10-03: 100 ug via INTRAMUSCULAR

## 2013-10-03 MED ORDER — ONDANSETRON HCL 4 MG/2ML IJ SOLN: 4.0000 mg | Freq: Four times a day (QID) | INTRAMUSCULAR | Status: DC | PRN

## 2013-10-03 MED ORDER — CITRIC ACID-SODIUM CITRATE 334-500 MG/5ML PO SOLN: 30.0000 mL | ORAL | Status: DC | PRN

## 2013-10-03 NOTE — H&P (Signed)
[name] [name] is a 23 y.o. female G2P1001 with IUP at 6543w0d by 6wk US presenting for early active labor. Pt states she has been having regular, every 3-5 minutes contractions, associated with spotting vaginal bleeding.  Membranes are intact, with active fetal movement.    [facility]  Prenatal History/Complications: Macrosomia in infant (9.9 lbs) delivered w/ vacuum s/p episiotomy  Past Medical History: Past Medical History  Diagnosis Date  . Breast feeding status of mother   . Migraine     Past Surgical History: Past Surgical History  Procedure Laterality Date  . Tonsillectomy      Obstetrical History: OB History   Grav Para Term Preterm Abortions TAB SAB Ect Mult Living   2 1 1  0 0 0 0 0 0 1     Social History: History   Social History  . Marital Status: Single    Spouse Name: N/A    Number of Children: N/A  . Years of Education: N/A   Social History Main Topics  . Smoking status: Former Games developermoker  . Smokeless tobacco: Never Used  . Alcohol Use: No  . Drug Use: No  . Sexual Activity: Yes    Birth Control/ Protection: None   Other Topics Concern  . None   Social History Narrative  . None    Family History: Family History  Problem Relation Age of Onset  . Cancer Maternal Grandmother   . Hypertension Paternal Grandmother   . Diabetes Paternal Grandmother     Allergies: No Known Allergies  Prescriptions prior to admission  Medication Sig Dispense Refill  . Prenatal Vit-Fe Fumarate-FA (PRENATAL MULTIVITAMIN) TABS Take 1 tablet by mouth every evening.         Review of Systems: Negative unless otherwise stated in History above  Physicial Blood pressure 105/64, pulse 93, temperature 98.1 F (36.7 C), temperature source Oral, resp. rate 18, height 5\' 5"  (1.651 m), weight 93.985 kg (207 lb 3.2 oz), last menstrual period 01/01/2013. General appearance: alert, cooperative and no distress Lungs: clear to auscultation bilaterally Heart:  regular rate and rhythm Abdomen: soft, non-tender; bowel sounds normal Extremities: Homans sign is negative, no sign of DVT Presentation: cephalic Fetal monitoringBaseline: 145 bpm, Variability: Good {> 6 bpm), Accelerations: Reactive and Decelerations: Absent Uterine activityFrequency: Every 4 minutes Dilation: 3.5 Effacement (%): 70 Station: -2 Exam by:: Alveda ReasonsAlexandra Gagliardo, RN (changed from 2.5cm)  Prenatal labs: ABO, Rh: A/POS/-- (09/16 [id number]) Antibody: NEG (09/16 0952) Rubella:   RPR: NON REAC (12/23 1646)  HBsAg: NEGATIVE (09/16 [id number])  HIV: NON REACTIVE (12/23 1646)  GBS:    GTT: wnl  Prenatal Transfer Tool  Maternal Diabetes: No Genetic Screening: Normal Maternal Ultrasounds/Referrals: Normal Fetal Ultrasounds or other Referrals:  None Maternal Substance Abuse:  No Significant Maternal Medications:  None Significant Maternal Lab Results: Lab values include: Group B Strep negative  No results found for this or any previous visit (from the past 24 hour(s)).  Assessment: [name] CarnesMorgan M [name] is a 23 y.o. G2P1001 at 1343w0d by here for active labor. She has Hx of Macrosomia in infant (9.9 lbs) delivered w/ vacuum s/p episiotomy  #Labor: expectant management; progressing normally #Pain: IV Fentanyl; epidural on request #FWB: Cat 1 #ID:  GBS neg #Feeding: Breast #MOC:Mirena #Circ:  NA - Female  [name] LowJames Joyner MD [facility] PGY-1 10/03/2013, 11:32 PM  I have seen and examined this patient and I agree with the above. Cam HaiSHAW, Afnan Emberton 1:33 AM 10/04/2013

## 2013-10-03 NOTE — MAU Note (Signed)
Contractions started early this morning and became more intense and closer together by this evening.  Denies LOF/VB.

## 2013-10-04 ENCOUNTER — Inpatient Hospital Stay (HOSPITAL_COMMUNITY): Payer: Medicaid Other | Admitting: Anesthesiology

## 2013-10-04 ENCOUNTER — Encounter: Payer: Self-pay | Admitting: *Deleted

## 2013-10-04 ENCOUNTER — Encounter (HOSPITAL_COMMUNITY): Payer: Medicaid Other | Admitting: Anesthesiology

## 2013-10-04 ENCOUNTER — Encounter (HOSPITAL_COMMUNITY): Payer: Self-pay | Admitting: *Deleted

## 2013-10-04 DIAGNOSIS — O3660X0 Maternal care for excessive fetal growth, unspecified trimester, not applicable or unspecified: Secondary | ICD-10-CM

## 2013-10-04 DIAGNOSIS — O9912 Other diseases of the blood and blood-forming organs and certain disorders involving the immune mechanism complicating childbirth: Secondary | ICD-10-CM

## 2013-10-04 DIAGNOSIS — D696 Thrombocytopenia, unspecified: Secondary | ICD-10-CM

## 2013-10-04 DIAGNOSIS — D689 Coagulation defect, unspecified: Secondary | ICD-10-CM

## 2013-10-04 LAB — LACTATE DEHYDROGENASE: LDH: 244 U/L (ref 94–250)

## 2013-10-04 LAB — CBC
HEMATOCRIT: 35.2 % — AB (ref 36.0–46.0)
HEMOGLOBIN: 12 g/dL (ref 12.0–15.0)
MCH: 29.8 pg (ref 26.0–34.0)
MCHC: 34.1 g/dL (ref 30.0–36.0)
MCV: 87.3 fL (ref 78.0–100.0)
Platelets: 123 10*3/uL — ABNORMAL LOW (ref 150–400)
RBC: 4.03 MIL/uL (ref 3.87–5.11)
RDW: 14.1 % (ref 11.5–15.5)
WBC: 15.5 10*3/uL — ABNORMAL HIGH (ref 4.0–10.5)

## 2013-10-04 LAB — COMPREHENSIVE METABOLIC PANEL
ALT: 13 U/L (ref 0–35)
AST: 22 U/L (ref 0–37)
Albumin: 2.9 g/dL — ABNORMAL LOW (ref 3.5–5.2)
Alkaline Phosphatase: 114 U/L (ref 39–117)
BUN: 7 mg/dL (ref 6–23)
CO2: 17 meq/L — AB (ref 19–32)
Calcium: 8.6 mg/dL (ref 8.4–10.5)
Chloride: 102 mEq/L (ref 96–112)
Creatinine, Ser: 0.39 mg/dL — ABNORMAL LOW (ref 0.50–1.10)
Glucose, Bld: 87 mg/dL (ref 70–99)
Potassium: 4.1 mEq/L (ref 3.7–5.3)
SODIUM: 135 meq/L — AB (ref 137–147)
TOTAL PROTEIN: 6.6 g/dL (ref 6.0–8.3)
Total Bilirubin: 0.2 mg/dL — ABNORMAL LOW (ref 0.3–1.2)

## 2013-10-04 LAB — PROTIME-INR
INR: 1.05 (ref 0.00–1.49)
Prothrombin Time: 13.5 seconds (ref 11.6–15.2)

## 2013-10-04 LAB — APTT: aPTT: 30 seconds (ref 24–37)

## 2013-10-04 LAB — HAPTOGLOBIN: Haptoglobin: 155 mg/dL (ref 45–215)

## 2013-10-04 LAB — RPR: RPR Ser Ql: NONREACTIVE

## 2013-10-04 LAB — PREPARE RBC (CROSSMATCH)

## 2013-10-04 LAB — ABO/RH: ABO/RH(D): A POS

## 2013-10-04 MED ORDER — DIBUCAINE 1 % RE OINT: 1.0000 "application " | TOPICAL_OINTMENT | RECTAL | Status: DC | PRN

## 2013-10-04 MED ORDER — LACTATED RINGERS IV SOLN
500.0000 mL | Freq: Once | INTRAVENOUS | Status: AC
  Administered 2013-10-04: 02:00:00 via INTRAVENOUS

## 2013-10-04 MED ORDER — EPHEDRINE 5 MG/ML INJ
10.0000 mg | INTRAVENOUS | Status: DC | PRN
  Filled 2013-10-04: qty 2

## 2013-10-04 MED ORDER — WITCH HAZEL-GLYCERIN EX PADS: 1.0000 "application " | MEDICATED_PAD | CUTANEOUS | Status: DC | PRN

## 2013-10-04 MED ORDER — TETANUS-DIPHTH-ACELL PERTUSSIS 5-2.5-18.5 LF-MCG/0.5 IM SUSP
0.5000 mL | Freq: Once | INTRAMUSCULAR | Status: AC
  Administered 2013-10-05: 0.5 mL via INTRAMUSCULAR

## 2013-10-04 MED ORDER — EPHEDRINE 5 MG/ML INJ
10.0000 mg | INTRAVENOUS | Status: DC | PRN
  Filled 2013-10-04: qty 4
  Filled 2013-10-04: qty 2
  Filled 2013-10-04: qty 4

## 2013-10-04 MED ORDER — LANOLIN HYDROUS EX OINT: TOPICAL_OINTMENT | CUTANEOUS | Status: DC | PRN

## 2013-10-04 MED ORDER — FENTANYL 2.5 MCG/ML BUPIVACAINE 1/10 % EPIDURAL INFUSION (WH - ANES)
14.0000 mL/h | INTRAMUSCULAR | Status: DC | PRN
  Administered 2013-10-04: 14 mL/h via EPIDURAL
  Filled 2013-10-04 (×3): qty 125

## 2013-10-04 MED ORDER — ZOLPIDEM TARTRATE 5 MG PO TABS: 5.0000 mg | ORAL_TABLET | Freq: Every evening | ORAL | Status: DC | PRN

## 2013-10-04 MED ORDER — DIPHENHYDRAMINE HCL 25 MG PO CAPS: 25.0000 mg | ORAL_CAPSULE | Freq: Four times a day (QID) | ORAL | Status: DC | PRN

## 2013-10-04 MED ORDER — DIPHENHYDRAMINE HCL 50 MG/ML IJ SOLN: 12.5000 mg | INTRAMUSCULAR | Status: DC | PRN

## 2013-10-04 MED ORDER — SENNOSIDES-DOCUSATE SODIUM 8.6-50 MG PO TABS
2.0000 | ORAL_TABLET | ORAL | Status: DC
  Administered 2013-10-05: 2 via ORAL
  Filled 2013-10-04: qty 2

## 2013-10-04 MED ORDER — PHENYLEPHRINE 40 MCG/ML (10ML) SYRINGE FOR IV PUSH (FOR BLOOD PRESSURE SUPPORT)
80.0000 ug | PREFILLED_SYRINGE | INTRAVENOUS | Status: DC | PRN
  Administered 2013-10-04: 80 ug via INTRAVENOUS
  Filled 2013-10-04: qty 2

## 2013-10-04 MED ORDER — OXYCODONE-ACETAMINOPHEN 5-325 MG PO TABS: 1.0000 | ORAL_TABLET | ORAL | Status: DC | PRN

## 2013-10-04 MED ORDER — SIMETHICONE 80 MG PO CHEW: 80.0000 mg | CHEWABLE_TABLET | ORAL | Status: DC | PRN

## 2013-10-04 MED ORDER — IBUPROFEN 600 MG PO TABS
600.0000 mg | ORAL_TABLET | Freq: Four times a day (QID) | ORAL | Status: DC
  Administered 2013-10-04 – 2013-10-05 (×4): 600 mg via ORAL
  Filled 2013-10-04 (×5): qty 1

## 2013-10-04 MED ORDER — FENTANYL 2.5 MCG/ML BUPIVACAINE 1/10 % EPIDURAL INFUSION (WH - ANES)
INTRAMUSCULAR | Status: DC | PRN
  Administered 2013-10-04: 14 mL/h via EPIDURAL

## 2013-10-04 MED ORDER — PHENYLEPHRINE 40 MCG/ML (10ML) SYRINGE FOR IV PUSH (FOR BLOOD PRESSURE SUPPORT)
80.0000 ug | PREFILLED_SYRINGE | INTRAVENOUS | Status: DC | PRN
  Filled 2013-10-04: qty 2
  Filled 2013-10-04 (×3): qty 10

## 2013-10-04 MED ORDER — LIDOCAINE HCL (PF) 1 % IJ SOLN
INTRAMUSCULAR | Status: DC | PRN
  Administered 2013-10-04 (×2): 4 mL

## 2013-10-04 MED ORDER — PRENATAL MULTIVITAMIN CH
1.0000 | ORAL_TABLET | Freq: Every day | ORAL | Status: DC
  Administered 2013-10-05: 1 via ORAL
  Filled 2013-10-04: qty 1

## 2013-10-04 MED ORDER — ONDANSETRON HCL 4 MG PO TABS: 4.0000 mg | ORAL_TABLET | ORAL | Status: DC | PRN

## 2013-10-04 MED ORDER — OXYTOCIN 40 UNITS IN LACTATED RINGERS INFUSION - SIMPLE MED
1.0000 m[IU]/min | INTRAVENOUS | Status: DC
  Administered 2013-10-04: 2 m[IU]/min via INTRAVENOUS

## 2013-10-04 MED ORDER — BENZOCAINE-MENTHOL 20-0.5 % EX AERO
1.0000 "application " | INHALATION_SPRAY | CUTANEOUS | Status: DC | PRN
  Filled 2013-10-04: qty 56

## 2013-10-04 MED ORDER — ONDANSETRON HCL 4 MG/2ML IJ SOLN: 4.0000 mg | INTRAMUSCULAR | Status: DC | PRN

## 2013-10-04 MED ORDER — TERBUTALINE SULFATE 1 MG/ML IJ SOLN: 0.2500 mg | Freq: Once | INTRAMUSCULAR | Status: DC | PRN

## 2013-10-04 NOTE — Progress Notes (Signed)
Dr. Malen GauzeFoster told patient's blood pressure. Ordered fluid bolus and phenylephrine

## 2013-10-04 NOTE — Progress Notes (Signed)
Plan is to repeat CBC

## 2013-10-04 NOTE — Anesthesia Procedure Notes (Signed)
Epidural Patient location during procedure: OB Start time: 10/04/2013 2:08 AM  Staffing Anesthesiologist: Mal AmabileFOSTER, Maninder Deboer A. Performed by: anesthesiologist   Preanesthetic Checklist Completed: patient identified, site marked, surgical consent, pre-op evaluation, timeout performed, IV checked, risks and benefits discussed and monitors and equipment checked  Epidural Patient position: sitting Prep: site prepped and draped and DuraPrep Patient monitoring: continuous pulse ox and blood pressure Approach: midline Location: L3-L4 Injection technique: LOR air  Needle:  Needle type: Tuohy  Needle gauge: 17 G Needle length: 9 cm and 9 Needle insertion depth: 5 cm cm Catheter type: closed end flexible Catheter size: 19 Gauge Catheter at skin depth: 10 cm Test dose: negative and Other  Assessment Events: blood not aspirated, injection not painful, no injection resistance, negative IV test and no paresthesia  Additional Notes Patient identified. Risks and benefits discussed including failed block, incomplete  Pain control, post dural puncture headache, nerve damage, paralysis, blood pressure Changes, nausea, vomiting, reactions to medications-both toxic and allergic and post Partum back pain. All questions were answered. Patient expressed understanding and wished to proceed. Sterile technique was used throughout procedure. Epidural site was Dressed with sterile barrier dressing. No paresthesias, signs of intravascular injection Or signs of intrathecal spread were encountered.  Patient was more comfortable after the epidural was dosed. Please see RN's note for documentation of vital signs and FHR which are stable.

## 2013-10-04 NOTE — Progress Notes (Signed)
[name] [name] is a 23 y.o. G2P1001 at 5765w1d by ultrasound admitted for active labor  Subjective: No complaints; pain much improved with epidural   Objective: BP 95/54  Pulse 92  Temp(Src) 97.9 F (36.6 C) (Oral)  Resp 18  Ht 5\' 5"  (1.651 m)  Wt 93.985 kg (207 lb 3.2 oz)  BMI 34.48 kg/m2  SpO2 98%  LMP 01/01/2013     FHT:  FHR: 140 bpm, variability: moderate,  accelerations:  Abscent,  decelerations:  Absent UC:   regular, every 3-5 minutes SVE:   Dilation: 5 Effacement (%): 50 Station: -2 Exam by:: [employer] Licato RN  Labs: Lab Results  Component Value Date   WBC 15.5* 10/04/2013   HGB 12.0 10/04/2013   HCT 35.2* 10/04/2013   MCV 87.3 10/04/2013   PLT 123* 10/04/2013    Assessment / Plan: Spontaneous labor, progressing normally; Hx of Macrosomia in infant (9.9 lbs) delivered w/ vacuum s/p episiotomy  Labor: Progressing normally Preeclampsia:  na Fetal Wellbeing:  Category I Pain Control:  Epidural I/D:  GBS negative Anticipated MOD:  NSVD  Wenda LowJoyner, Doyt Castellana 10/04/2013, 5:49 AM

## 2013-10-04 NOTE — Anesthesia Postprocedure Evaluation (Signed)
Anesthesia Post Note  Patient: [name] [name]  Procedure(s) Performed: * No procedures listed *  Anesthesia type: Epidural  Patient location: Mother/Baby  Post pain: Pain level controlled  Post assessment: Post-op Vital signs reviewed  Last Vitals:  Filed Vitals:   10/04/13 1505  BP: 102/60  Pulse: 74  Temp: 36.7 C  Resp: 18    Post vital signs: Reviewed  Level of consciousness:alert  Complications: No apparent anesthesia complications Anesthesia Post-op Note  Patient: [name] [name]  Procedure(s) Performed: * No procedures listed *  Patient Location: PACU and Mother/Baby  Anesthesia Type:Epidural  Level of Consciousness: awake, alert , oriented and patient cooperative  Airway and Oxygen Therapy: Patient Spontanous Breathing  Post-op Pain: none  Post-op Assessment: Post-op Vital signs reviewed, No headache, No backache, No residual numbness and No residual motor weakness  Post-op Vital Signs: Reviewed and stable  Complications: No apparent anesthesia complications

## 2013-10-04 NOTE — Progress Notes (Signed)
Dr. Malen GauzeFoster will wait for new lab results before placing epidural. He is okay with giving fentanyl while we wait for lab results.

## 2013-10-04 NOTE — Lactation Note (Signed)
This note was copied from the chart of [name] [name]. Lactation Consultation Note  Patient Name: [name] Clotilde DieterMorgan Mchaffie ZOXWR'UToday's Date: 10/04/2013 Reason for consult: Initial assessment of this mom and baby at 8 hours post-delivery.  Mom is a second-time breastfeeding mom; states she breastfed her 89 yo for 2-3 months.  This newborn had initial LATCH score of 10 and mom denies latching problems thus far.  Mom reports being able to hand express her colostrum and is aware of cue feedings and STS benefits.  LC encouraged review of Baby and Me pp 9, 14 and 20-25 for STS and BF information. LC provided [facility] [employer] Resource brochure and reviewed [facility] services and list of community and web site resources.    Maternal Data Formula Feeding for Exclusion: No Infant to breast within first hour of birth: Yes (initial LATCH score=10) Has patient been taught Hand Expression?: Yes (mom experienced and states she is able to express colostrum) Does the patient have breastfeeding experience prior to this delivery?: Yes  Feeding Feeding Type: Breast Fed Length of feed: 17 min  LATCH Score/Interventions              initial LATCH score=10 after delivery        Lactation Tools Discussed/Used   STS, hand expression, cue feedings  Consult Status Consult Status: Follow-up Date: 10/05/13 Follow-up type: In-patient    Warrick ParisianBryant, Marshal Schrecengost [facility] 10/04/2013, 8:23 PM

## 2013-10-04 NOTE — Anesthesia Preprocedure Evaluation (Signed)
Anesthesia Evaluation  Patient identified by MRN, date of birth, ID band Patient awake    Reviewed: Allergy & Precautions, H&P , Patient's Chart, lab work & pertinent test results  Airway Mallampati: III TM Distance: >3 FB Neck ROM: Full    Dental no notable dental hx. (+) Teeth Intact   Pulmonary former smoker,  breath sounds clear to auscultation  Pulmonary exam normal       Cardiovascular negative cardio ROS  Rhythm:Regular Rate:Normal     Neuro/Psych  Headaches, negative psych ROS   GI/Hepatic negative GI ROS, Neg liver ROS,   Endo/Other  Obesity  Renal/GU negative Renal ROS  negative genitourinary   Musculoskeletal   Abdominal Normal abdominal exam  (+) + obese,   Peds  Hematology  (+) anemia , Severe anemia  Thrombocytopenia   Anesthesia Other Findings   Reproductive/Obstetrics (+) Pregnancy and Breast feeding                            Anesthesia Physical Anesthesia Plan  ASA: II  Anesthesia Plan: Epidural   Post-op Pain Management:    Induction:   Airway Management Planned: Natural Airway  Additional Equipment:   Intra-op Plan:   Post-operative Plan:   Informed Consent: I have reviewed the patients History and Physical, chart, labs and discussed the procedure including the risks, benefits and alternatives for the proposed anesthesia with the patient or authorized representative who has indicated his/her understanding and acceptance.     Plan Discussed with: Anesthesiologist  Anesthesia Plan Comments:         Anesthesia Quick Evaluation

## 2013-10-04 NOTE — Progress Notes (Signed)
[name] [name] is a 23 y.o. G2P1001 at 7082w1d by ultrasound admitted for active labor  Subjective: No complaints; pain much improved with epidural   Objective: BP 107/57  Pulse 85  Temp(Src) 98.3 F (36.8 C) (Oral)  Resp 20  Ht 5\' 5"  (1.651 m)  Wt 93.985 kg (207 lb 3.2 oz)  BMI 34.48 kg/m2  SpO2 98%  LMP 01/01/2013     FHT:  FHR: 140 bpm, variability: moderate,  accelerations:  Abscent,  decelerations:  Absent UC:   irregular, every 5-7 minutes SVE:   Dilation: 8 Effacement (%): 80 Station: -2 Exam by:: Dr. Ike Benedom  Labs: Lab Results  Component Value Date   WBC 15.5* 10/04/2013   HGB 12.0 10/04/2013   HCT 35.2* 10/04/2013   MCV 87.3 10/04/2013   PLT 123* 10/04/2013    Assessment / Plan: Spontaneous labor, progressing normally; Hx of Macrosomia in infant (9.9 lbs) delivered w/ vacuum s/p episiotomy  Labor: Progressing normally, s/p AROM and will augment with pit to inc ctx pattern Preeclampsia:  na Fetal Wellbeing:  Category I Pain Control:  Epidural I/D:  GBS negative Anticipated MOD:  NSVD  [name] 10/04/2013, 9:57 AM

## 2013-10-04 NOTE — Progress Notes (Signed)
Dr. Gayla DossJoyner told about SVE and UC pattern. Dr. Gayla DossJoyner will recheck patient at 6am.

## 2013-10-05 MED ORDER — IBUPROFEN 600 MG PO TABS: 600.0000 mg | ORAL_TABLET | Freq: Four times a day (QID) | ORAL | Status: DC

## 2013-10-05 NOTE — Discharge Summary (Signed)
Obstetric Discharge Summary Reason for Admission: onset of labor Prenatal Procedures: ultrasound Intrapartum Procedures: spontaneous vaginal delivery Postpartum Procedures: none Complications-Operative and Postpartum: none Hemoglobin  Date Value Ref Range Status  10/04/2013 12.0  12.0 - 15.0 g/dL Final     HCT  Date Value Ref Range Status  10/04/2013 35.2* 36.0 - 46.0 % Final    Discharge Diagnoses: Term Pregnancy-delivered  Hospital Course:  [name] [name] is a 23 y.o. [id number] who presented with active labor.  She had a uncomplicated SVD. She was able to ambulate, tolerate PO and void normally. She was discharged home with instructions for postpartum care.  Pt plans on using Mirena and will be breastfeeding.  Delivery Note  At 11:36 AM a viable female was delivered via Vaginal, Spontaneous Delivery (Presentation: Left Occiput Anterior). APGAR: pending but crying at perineum ; weight .  Placenta status: Intact, Spontaneous. Cord: 3 vessels with the following complications: . Cord pH: NA  NSVD over 1st degree tear. Active manamgent of 3rd stage with pit and traction delivered intact placenta with 3v cord. 1st degree repaired in usual manner. EBL 300, counts correct, hemostatic.  Anesthesia: Epidural  Episiotomy:  Lacerations: 1st degree  Suture Repair: 3.0 monocryl on CT  Est. Blood Loss (mL): 300  Mom to postpartum. Baby to Couplet care / Skin to Skin.  Tawana ScaleODOM, MICHAEL RYAN  10/04/2013, 11:53 AM   Physical Exam:  General: alert and cooperative Lochia: appropriate Uterine Fundus: firm DVT Evaluation: No evidence of DVT seen on physical exam.  Discharge Information: Date: 10/05/2013 Activity: pelvic rest Diet: routine Medications: Ibuprofen Baby feeding: plans to breastfeed Contraception: IUD Condition: stable Instructions: refer to practice specific booklet Discharge to: home   Newborn Data: Live born female  Birth Weight: 7 lb 11.3 oz (3495 g) APGAR: 8, 9  Home  with mother.  Allyne GeeBrad Winkel, PA-S  I have seen and examined this patient and agree with above documentation in the PA student's note.   Rulon AbideKeli Starlin Steib, M.D. [facility] 10/05/2013 11:39 AM

## 2013-10-05 NOTE — H&P (Signed)
`````  Attestation of Attending Supervision of Advanced Practitioner: Evaluation and management procedures were performed by the PA/NP/CNM/OB Fellow under my supervision/collaboration. Chart reviewed and agree with management and plan.  Myda Detwiler V 10/05/2013 6:40 PM

## 2013-10-05 NOTE — Progress Notes (Signed)
Patient was referred for history of depression/anxiety. * Referral screened out by Clinical Social Worker because none of the following criteria appear to apply:  ~ History of anxiety/depression during this pregnancy, or of post partum depression.  ~ Diagnosis of anxiety and/or depression within last 3 years  ~ History of depression due to pregnancy loss/loss of child  OR * Patient's symptoms currently being treated with medication and/or therapy.  Please contact the Clinical Social Worker if needs arise, or by the patient's request. Pt denies any history of anxiety disorder or being prescribed Xanax.  Pt & FOB told CSW that information incorrect.  CSW intervention was not provided.  

## 2013-10-05 NOTE — Discharge Instructions (Signed)

## 2013-10-06 LAB — TYPE AND SCREEN
ABO/RH(D): A POS
ANTIBODY SCREEN: NEGATIVE
Unit division: 0
Unit division: 0

## 2013-10-09 ENCOUNTER — Encounter: Payer: Medicaid Other | Admitting: Family Medicine

## 2013-11-14 ENCOUNTER — Encounter: Payer: Self-pay | Admitting: Obstetrics & Gynecology

## 2013-11-14 ENCOUNTER — Ambulatory Visit (INDEPENDENT_AMBULATORY_CARE_PROVIDER_SITE_OTHER): Payer: Medicaid Other | Admitting: Obstetrics & Gynecology

## 2013-11-14 VITALS — BP 125/73 | HR 81 | Temp 97.7°F | Ht 65.0 in | Wt 182.2 lb

## 2013-11-14 DIAGNOSIS — Z30430 Encounter for insertion of intrauterine contraceptive device: Secondary | ICD-10-CM

## 2013-11-14 DIAGNOSIS — Z01812 Encounter for preprocedural laboratory examination: Secondary | ICD-10-CM

## 2013-11-14 DIAGNOSIS — Z3009 Encounter for other general counseling and advice on contraception: Secondary | ICD-10-CM

## 2013-11-14 LAB — POCT PREGNANCY, URINE: Preg Test, Ur: NEGATIVE

## 2013-11-14 MED ORDER — LEVONORGESTREL 20 MCG/24HR IU IUD
INTRAUTERINE_SYSTEM | Freq: Once | INTRAUTERINE | Status: AC
  Administered 2013-11-14: 1 via INTRAUTERINE

## 2013-11-14 NOTE — Progress Notes (Signed)
Patient ID: Dorris CarnesMorgan M Dom, female   DOB: Feb 08, 1991, 23 y.o.   MRN: [id number] Attestation of Attending Supervision of Resident: Evaluation and management procedures were performed by the [facility] Resident under my supervision.  I have seen and examined the patient, reviewed the resident's note and chart, and I agree with the management and plan.  I was present for the entire procedure.  Anibal Hendersonarolyn L Harraway-Smith, M.D. 11/14/2013 4:46 PM

## 2013-11-14 NOTE — Progress Notes (Signed)
Patient ID: [name] CarnesMorgan M Vandergriff, female   DOB: [birth year], 23 y.o.   MRN: [id number] Subjective:     [name] [name] is a 23 y.o. female who presents for a postpartum visit. She is 6 weeks postpartum following a spontaneous vaginal delivery. I have fully reviewed the prenatal and intrapartum course. The delivery was at 1771w1d gestational weeks. Outcome: spontaneous vaginal delivery. Anesthesia: epidural. Postpartum course has been unremarkable. Baby's course has been unremarkable. Baby is feeding by breast. Bleeding no bleeding. Patient is not sexually active. Contraception method is abstinence. Postpartum depression screening: negative. Bonding well with baby. Desires mirena IUD for birth control.   Review of Systems Pertinent items are noted in HPI.   Objective:    BP 125/73  Pulse 81  Temp(Src) 97.7 F (36.5 C) (Oral)  Ht 5\' 5"  (1.651 m)  Wt 182 lb 3.2 oz (82.645 kg)  BMI 30.32 kg/m2  Breastfeeding? Yes  General:  alert, cooperative and no distress  Lungs: normal respiratory effort  Abdomen: soft, nontender to palpation, no masses or organomegaly   Vulva:  normal  Vagina: normal vagina  Cervix:  multiparous appearance and no lesions  Corpus: normal  Adnexa:  not evaluated  Rectal Exam: Not performed.         IUD Insertion Procedure Note Patient identified, informed consent performed.  Discussed risks of irregular bleeding, cramping, infection, malpositioning or misplacement of the IUD outside the uterus which may require further procedures. Time out was performed.  Urine pregnancy test negative.  Speculum placed in the vagina.  Cervix visualized.  Cleaned with Betadine x 2.  Grasped anteriorly with a single tooth tenaculum.  Uterus sounded to 8 cm.  Mirena IUD placed per manufacturer's recommendations.  Strings trimmed to 3 cm. Tenaculum was removed, good hemostasis noted.  Patient tolerated procedure well. Procedure supervised in its entirety by Dr. Erin FullingHarraway-Smith.  Patient was  given post-procedure instructions.  Patient was also asked to check IUD strings periodically and follow up in 4 weeks for IUD check.   Assessment:     Normal postpartum exam. Pap smear not done at today's visit.   Plan:    1. Contraception: IUD inserted today 2. Follow up in: 4 weeks for string check or sooner as needed.   Latrelle DodrillBrittany J Johanna Matto, MD Family Medicine PGY-2

## 2013-11-14 NOTE — Patient Instructions (Signed)
Levonorgestrel intrauterine device (IUD) What is this medicine? LEVONORGESTREL IUD (LEE voe nor jes trel) is a contraceptive (birth control) device. The device is placed inside the uterus by a healthcare professional. It is used to prevent pregnancy and can also be used to treat heavy bleeding that occurs during your period. Depending on the device, it can be used for 3 to 5 years. This medicine may be used for other purposes; ask your health care provider or pharmacist if you have questions. COMMON BRAND NAME(S): Mirena, Skyla What should I tell my health care provider before I take this medicine? They need to know if you have any of these conditions: -abnormal Pap smear -cancer of the breast, uterus, or cervix -diabetes -endometritis -genital or pelvic infection now or in the past -have more than one sexual partner or your partner has more than one partner -heart disease -history of an ectopic or tubal pregnancy -immune system problems -IUD in place -liver disease or tumor -problems with blood clots or take blood-thinners -use intravenous drugs -uterus of unusual shape -vaginal bleeding that has not been explained -an unusual or allergic reaction to levonorgestrel, other hormones, silicone, or polyethylene, medicines, foods, dyes, or preservatives -pregnant or trying to get pregnant -breast-feeding How should I use this medicine? This device is placed inside the uterus by a health care professional. Talk to your pediatrician regarding the use of this medicine in children. Special care may be needed. Overdosage: If you think you have taken too much of this medicine contact a poison control center or emergency room at once. NOTE: This medicine is only for you. Do not share this medicine with others. What if I miss a dose? This does not apply. What may interact with this medicine? Do not take this medicine with any of the following  medications: -amprenavir -bosentan -fosamprenavir This medicine may also interact with the following medications: -aprepitant -barbiturate medicines for inducing sleep or treating seizures -bexarotene -griseofulvin -medicines to treat seizures like carbamazepine, ethotoin, felbamate, oxcarbazepine, phenytoin, topiramate -modafinil -pioglitazone -rifabutin -rifampin -rifapentine -some medicines to treat HIV infection like atazanavir, indinavir, lopinavir, nelfinavir, tipranavir, ritonavir -St. John's wort -warfarin This list may not describe all possible interactions. Give your health care provider a list of all the medicines, herbs, non-prescription drugs, or dietary supplements you use. Also tell them if you smoke, drink alcohol, or use illegal drugs. Some items may interact with your medicine. What should I watch for while using this medicine? Visit your doctor or health care professional for regular check ups. See your doctor if you or your partner has sexual contact with others, becomes HIV positive, or gets a sexual transmitted disease. This product does not protect you against HIV infection (AIDS) or other sexually transmitted diseases. You can check the placement of the IUD yourself by reaching up to the top of your vagina with clean fingers to feel the threads. Do not pull on the threads. It is a good habit to check placement after each menstrual period. Call your doctor right away if you feel more of the IUD than just the threads or if you cannot feel the threads at all. The IUD may come out by itself. You may become pregnant if the device comes out. If you notice that the IUD has come out use a backup birth control method like condoms and call your health care provider. Using tampons will not change the position of the IUD and are okay to use during your period. What side effects may I   notice from receiving this medicine? Side effects that you should report to your doctor or  health care professional as soon as possible: -allergic reactions like skin rash, itching or hives, swelling of the face, lips, or tongue -fever, flu-like symptoms -genital sores -high blood pressure -no menstrual period for 6 weeks during use -pain, swelling, warmth in the leg -pelvic pain or tenderness -severe or sudden headache -signs of pregnancy -stomach cramping -sudden shortness of breath -trouble with balance, talking, or walking -unusual vaginal bleeding, discharge -yellowing of the eyes or skin Side effects that usually do not require medical attention (report to your doctor or health care professional if they continue or are bothersome): -acne -breast pain -change in sex drive or performance -changes in weight -cramping, dizziness, or faintness while the device is being inserted -headache -irregular menstrual bleeding within first 3 to 6 months of use -nausea This list may not describe all possible side effects. Call your doctor for medical advice about side effects. You may report side effects to FDA at 1-800-FDA-1088. Where should I keep my medicine? This does not apply. NOTE: This sheet is a summary. It may not cover all possible information. If you have questions about this medicine, talk to your doctor, pharmacist, or health care provider.  2014, Elsevier/Gold Standard. (2011-08-12 13:54:04)  

## 2013-11-20 ENCOUNTER — Telehealth: Payer: Self-pay | Admitting: General Practice

## 2013-11-20 NOTE — Telephone Encounter (Signed)
Patient called in to front office stating she got her IUD inserted 6 days ago and initially she was spotting but now its like a regular period. Told patient she can have irregular bleeding like that where it goes from spotting to a period or vice versus most commonly in the first 2-3 months of the IUD but that over time this gets better. Told patient we would have concern if she started to have heavy heavy bleeding or severe abdominal pain. Patient verbalized understanding and had no further questions

## 2013-12-14 ENCOUNTER — Encounter: Payer: Self-pay | Admitting: Obstetrics & Gynecology

## 2013-12-14 ENCOUNTER — Encounter: Payer: Self-pay | Admitting: Obstetrics and Gynecology

## 2013-12-14 ENCOUNTER — Ambulatory Visit (INDEPENDENT_AMBULATORY_CARE_PROVIDER_SITE_OTHER): Payer: Medicaid Other | Admitting: Obstetrics and Gynecology

## 2013-12-14 VITALS — BP 112/77 | HR 77 | Temp 98.5°F | Ht 65.0 in | Wt 178.4 lb

## 2013-12-14 DIAGNOSIS — Z30431 Encounter for routine checking of intrauterine contraceptive device: Secondary | ICD-10-CM | POA: Insufficient documentation

## 2013-12-14 NOTE — Progress Notes (Signed)
[name] [name] 22 y.o.G2P2002 here for IUD check  SUBJECTIVE: Had Mirena inserted here 1 month ago and has has spotting and a short episode of bleeding since. Breastfeeding without supplementation. Husband could feel string during intercourse and she easily feels string manually.   OBJECTIVE: Filed Vitals:   12/14/13 0818  BP: 112/77  Pulse: 77  Temp: 98.5 F (36.9 C)   Gen: NAD Abd: soft, NT,  Spec: cx sl everted; not friable; no blood seen; strings coiled on right and extend about 4 cm> clipped to about 2.5-3 cm. No CMT Uterus well involuted  ASSESSMENT: Mirena check up with strings clipped  PLAN: Reassured re spotting Return prn.    Danae Orleans, CNM 12/14/2013 8:53 AM

## 2013-12-19 ENCOUNTER — Encounter: Payer: Self-pay | Admitting: General Practice

## 2014-03-09 ENCOUNTER — Encounter (HOSPITAL_COMMUNITY): Payer: Self-pay | Admitting: Emergency Medicine

## 2014-03-09 ENCOUNTER — Emergency Department (HOSPITAL_COMMUNITY)
Admission: EM | Admit: 2014-03-09 | Discharge: 2014-03-09 | Disposition: A | Discharge status: Home / Self Care | Payer: Medicaid Other | Attending: Emergency Medicine | Admitting: Emergency Medicine

## 2014-03-09 DIAGNOSIS — Z3202 Encounter for pregnancy test, result negative: Secondary | ICD-10-CM | POA: Diagnosis not present

## 2014-03-09 DIAGNOSIS — Z87891 Personal history of nicotine dependence: Secondary | ICD-10-CM | POA: Insufficient documentation

## 2014-03-09 DIAGNOSIS — R1084 Generalized abdominal pain: Secondary | ICD-10-CM | POA: Diagnosis present

## 2014-03-09 DIAGNOSIS — Z8679 Personal history of other diseases of the circulatory system: Secondary | ICD-10-CM | POA: Insufficient documentation

## 2014-03-09 DIAGNOSIS — R197 Diarrhea, unspecified: Secondary | ICD-10-CM | POA: Insufficient documentation

## 2014-03-09 DIAGNOSIS — R112 Nausea with vomiting, unspecified: Secondary | ICD-10-CM | POA: Diagnosis not present

## 2014-03-09 LAB — URINE MICROSCOPIC-ADD ON

## 2014-03-09 LAB — URINALYSIS, ROUTINE W REFLEX MICROSCOPIC
Bilirubin Urine: NEGATIVE
GLUCOSE, UA: NEGATIVE mg/dL
Ketones, ur: NEGATIVE mg/dL
Nitrite: NEGATIVE
Protein, ur: NEGATIVE mg/dL
SPECIFIC GRAVITY, URINE: 1.025 (ref 1.005–1.030)
Urobilinogen, UA: 0.2 mg/dL (ref 0.0–1.0)
pH: 7 (ref 5.0–8.0)

## 2014-03-09 LAB — PREGNANCY, URINE: Preg Test, Ur: NEGATIVE

## 2014-03-09 MED ORDER — LOPERAMIDE HCL 2 MG PO CAPS: 2.0000 mg | ORAL_CAPSULE | Freq: Four times a day (QID) | ORAL | Status: DC | PRN

## 2014-03-09 MED ORDER — ONDANSETRON 4 MG PO TBDP: 4.0000 mg | ORAL_TABLET | Freq: Three times a day (TID) | ORAL | Status: DC | PRN

## 2014-03-09 MED ORDER — ONDANSETRON HCL 4 MG/2ML IJ SOLN
4.0000 mg | Freq: Once | INTRAMUSCULAR | Status: DC
  Filled 2014-03-09: qty 2

## 2014-03-09 MED ORDER — SODIUM CHLORIDE 0.9 % IV BOLUS (SEPSIS)
1000.0000 mL | Freq: Once | INTRAVENOUS | Status: AC
  Administered 2014-03-09: 1000 mL via INTRAVENOUS

## 2014-03-09 NOTE — ED Provider Notes (Signed)
CSN: [id number]     Arrival date & time 03/09/14  4098 History   First MD Initiated Contact with Patient 03/09/14 0802     Chief Complaint  Patient presents with  . Emesis  . Diarrhea  . Abdominal Pain     (Consider location/radiation/quality/duration/timing/severity/associated sxs/prior Treatment) HPI Comments: Patient is a G49 P67 23 year old female presenting to the emergency department for acute onset nausea, 3 episodes of nonbloody nonbilious emesis, 4 episodes of nonbloody diarrhea with generalized abdominal cramping that began this morning at 5 AM. Patient states she is concerned she may have food poisoning. No alleviating or aggravating factors. Denies any known sick contacts. Denies any fevers or chills. No abdominal surgical history. Patient currently has an IUD in place and does not get withdrawal bleeding.   Past Medical History  Diagnosis Date  . Breast feeding status of mother   . Migraine    Past Surgical History  Procedure Laterality Date  . Tonsillectomy     Family History  Problem Relation Age of Onset  . Cancer Maternal Grandmother   . Hypertension Paternal Grandmother   . Diabetes Paternal Grandmother    History  Substance Use Topics  . Smoking status: Former Games developer  . Smokeless tobacco: Never Used  . Alcohol Use: No   OB History   Grav Para Term Preterm Abortions TAB SAB Ect Mult Living   2 2 2  0 0 0 0 0 0 2     Review of Systems  Gastrointestinal: Positive for nausea, vomiting, abdominal pain and diarrhea. Negative for constipation, blood in stool, abdominal distention, anal bleeding and rectal pain.  All other systems reviewed and are negative.     Allergies  Review of patient's allergies indicates no known allergies.  Home Medications   Prior to Admission medications   Medication Sig Start Date End Date Taking? Authorizing Provider  loperamide (IMODIUM) 2 MG capsule Take 1 capsule (2 mg total) by mouth 4 (four) times daily as needed for  diarrhea or loose stools. 03/09/14   Fortunato Nordin L Trinda Harlacher, PA-C  ondansetron (ZOFRAN ODT) 4 MG disintegrating tablet Take 1 tablet (4 mg total) by mouth every 8 (eight) hours as needed for nausea or vomiting. 03/09/14   Victorino Dike L Romir Klimowicz, PA-C   BP 114/69  Pulse 72  Temp(Src) 97.5 F (36.4 C) (Axillary)  Resp 16  SpO2 100% Physical Exam  Nursing note and vitals reviewed. Constitutional: She is oriented to person, place, and time. She appears well-developed and well-nourished. No distress.  HENT:  Head: Normocephalic and atraumatic.  Right Ear: External ear normal.  Left Ear: External ear normal.  Nose: Nose normal.  Mouth/Throat: Uvula is midline and oropharynx is clear and moist. Mucous membranes are dry.  Eyes: Conjunctivae are normal.  Neck: Normal range of motion. Neck supple.  Cardiovascular: Normal rate, regular rhythm and normal heart sounds.   Pulmonary/Chest: Effort normal and breath sounds normal. No respiratory distress.  Abdominal: Soft. Bowel sounds are normal. She exhibits no distension. There is no tenderness. There is no rebound and no guarding.  Musculoskeletal: Normal range of motion.  Neurological: She is alert and oriented to person, place, and time.  Skin: Skin is warm and dry. She is not diaphoretic.  Psychiatric: She has a normal mood and affect.    ED Course  Procedures (including critical care time) Medications  ondansetron (ZOFRAN) injection 4 mg (4 mg Intravenous Not Given 03/09/14 0933)  sodium chloride 0.9 % bolus 1,000 mL (0 mLs Intravenous Stopped  03/09/14 1154)    Labs Review Labs Reviewed  URINALYSIS, ROUTINE W REFLEX MICROSCOPIC - Abnormal; Notable for the following:    APPearance CLOUDY (*)    Hgb urine dipstick SMALL (*)    Leukocytes, UA SMALL (*)    All other components within normal limits  URINE MICROSCOPIC-ADD ON - Abnormal; Notable for the following:    Squamous Epithelial / LPF MANY (*)    Bacteria, UA FEW (*)    All other  components within normal limits  URINE CULTURE  PREGNANCY, URINE    Imaging Review No results found.   EKG Interpretation None      MDM   Final diagnoses:  Nausea vomiting and diarrhea    Filed Vitals:   03/09/14 1152  BP: 114/69  Pulse: 72  Temp:   Resp:    Afebrile, NAD, non-toxic appearing, AAOx4.  Patient with symptoms consistent with viral gastroenteritis.  Vitals are stable, no fever.  No signs of dehydration, tolerating PO fluids > 6 oz.  Lungs are clear.  No focal abdominal pain, no concern for appendicitis, cholecystitis, pancreatitis, ruptured viscus, UTI, kidney stone, or any other abdominal etiology.  Supportive therapy indicated with return if symptoms worsen.  Patient counseled. Patient is agreeable to plan. Patient is stable at time of discharge       Jeannetta EllisJennifer L Parilee Hally, PA-C 03/09/14 1506

## 2014-03-09 NOTE — Discharge Instructions (Signed)
Please follow up with your primary care physician in 1-2 days. If you do not have one please call the [facility] number listed above. Please take medications as prescribed. Please read all discharge instructions and return precautions.  ° °Viral Gastroenteritis °Viral gastroenteritis is also known as stomach flu. This condition affects the stomach and intestinal tract. It can cause sudden diarrhea and vomiting. The illness typically lasts 3 to 8 days. Most people develop an immune response that eventually gets rid of the virus. While this natural response develops, the virus can make you quite ill. °CAUSES  °Many different viruses can cause gastroenteritis, such as rotavirus or noroviruses. You can catch one of these viruses by consuming contaminated food or water. You may also catch a virus by sharing utensils or other personal items with an infected person or by touching a contaminated surface. °SYMPTOMS  °The most common symptoms are diarrhea and vomiting. These problems can cause a severe loss of body fluids (dehydration) and a body salt (electrolyte) imbalance. Other symptoms may include: °· Fever. °· Headache. °· Fatigue. °· Abdominal pain. °DIAGNOSIS  °Your caregiver can usually diagnose viral gastroenteritis based on your symptoms and a physical exam. A stool sample may also be taken to test for the presence of viruses or other infections. °TREATMENT  °This illness typically goes away on its own. Treatments are aimed at rehydration. The most serious cases of viral gastroenteritis involve vomiting so severely that you are not able to keep fluids down. In these cases, fluids must be given through an intravenous line (IV). °HOME CARE INSTRUCTIONS  °· Drink enough fluids to keep your urine clear or pale yellow. Drink small amounts of fluids frequently and increase the amounts as tolerated. °· Ask your caregiver for specific rehydration instructions. °· Avoid: °¨ Foods high in  sugar. °¨ Alcohol. °¨ Carbonated drinks. °¨ Tobacco. °¨ Juice. °¨ Caffeine drinks. °¨ Extremely hot or cold fluids. °¨ Fatty, greasy foods. °¨ Too much intake of anything at one time. °¨ Dairy products until 24 to 48 hours after diarrhea stops. °· You may consume probiotics. Probiotics are active cultures of beneficial bacteria. They may lessen the amount and number of diarrheal stools in adults. Probiotics can be found in yogurt with active cultures and in supplements. °· Wash your hands well to avoid spreading the virus. °· Only take over-the-counter or prescription medicines for pain, discomfort, or fever as directed by your caregiver. Do not give aspirin to children. Antidiarrheal medicines are not recommended. °· Ask your caregiver if you should continue to take your regular prescribed and over-the-counter medicines. °· Keep all follow-up appointments as directed by your caregiver. °SEEK IMMEDIATE MEDICAL CARE IF:  °· You are unable to keep fluids down. °· You do not urinate at least once every 6 to 8 hours. °· You develop shortness of breath. °· You notice blood in your stool or vomit. This may look like coffee grounds. °· You have abdominal pain that increases or is concentrated in one small area (localized). °· You have persistent vomiting or diarrhea. °· You have a fever. °· The patient is a child younger than 3 months, and he or she has a fever. °· The patient is a child older than 3 months, and he or she has a fever and persistent symptoms. °· The patient is a child older than 3 months, and he or she has a fever and symptoms suddenly get worse. °· The patient is a baby, and he or   she has no tears when crying. °MAKE SURE YOU:  °· Understand these instructions. °· Will watch your condition. °· Will get help right away if you are not doing well or get worse. °Document Released: 07/12/2005 Document Revised: 10/04/2011 Document Reviewed: 04/28/2011 °ExitCare® Patient Information ©2015 [employer]. This  information is not intended to replace advice given to you by your health care provider. Make sure you discuss any questions you have with your health care provider. ° ° °

## 2014-03-09 NOTE — ED Notes (Signed)
Pt states that she began having N/V/D and abdominal pain this morning reports multiple episodes of V/D.

## 2014-03-10 NOTE — ED Provider Notes (Signed)
Medical screening examination/treatment/procedure(s) were performed by non-physician practitioner and as supervising physician I was immediately available for consultation/collaboration.  Bernardette Waldron L Kennard Fildes, MD 03/10/14 0821 

## 2014-03-11 LAB — URINE CULTURE: Colony Count: >=100000

## 2014-03-19 ENCOUNTER — Encounter: Payer: Self-pay | Admitting: General Practice

## 2014-05-27 ENCOUNTER — Encounter (HOSPITAL_COMMUNITY): Payer: Self-pay | Admitting: Emergency Medicine

## 2015-05-18 IMAGING — US US OB COMP LESS 14 WK
1 series · 14 of 28 positions shown · non-contrast
Comparison: None

CLINICAL DATA: ABDOMINAL PAIN DIZZINESS,

OBSTETRIC <14 WK US
TECHNIQUE: Transabdominal ultrasound examination was performed for
complete evaluation of the gestation as well as the maternal
uterus, adnexal regions, and pelvic cul-de-sac.

[Series 1: us ob comp less 14 wk · 0.25mm/px · 64 acquisitions, 14 frames shown]
[im 3/64]
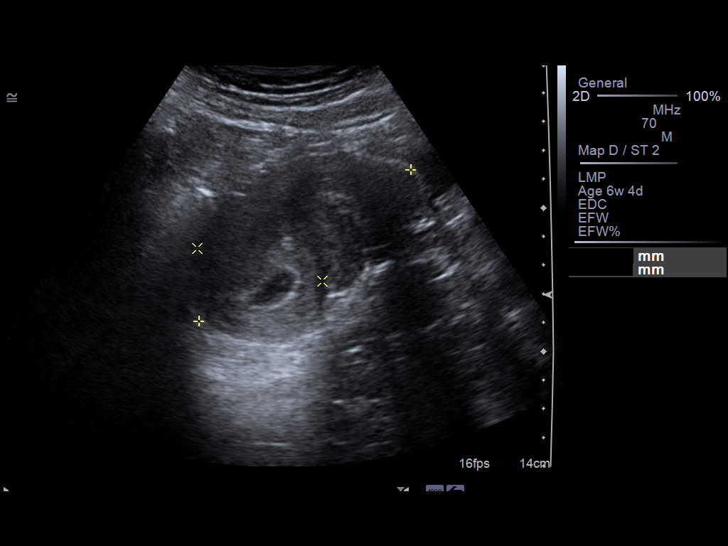
[im 8/64]
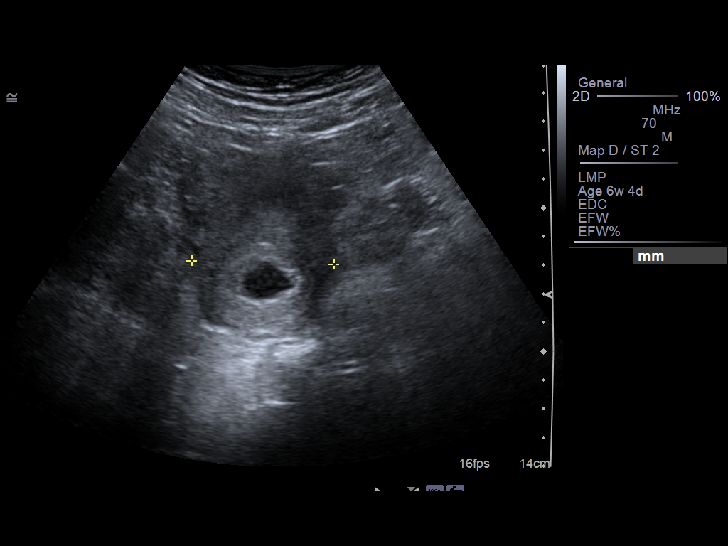
[im 12/64]
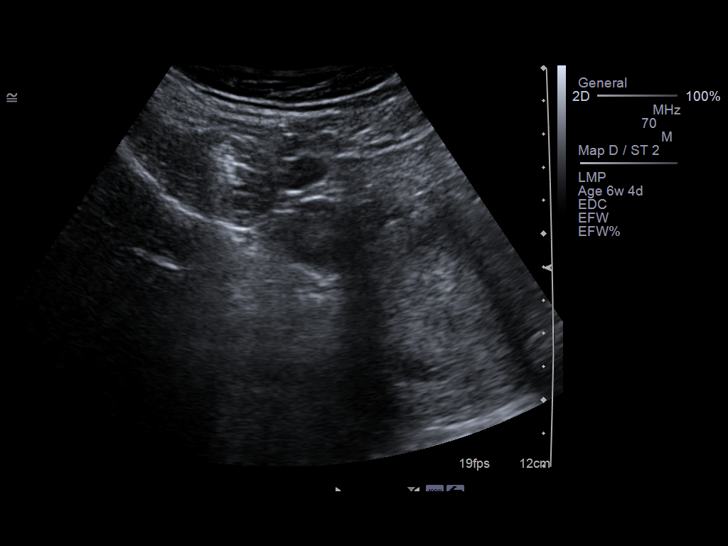
[im 17/64]
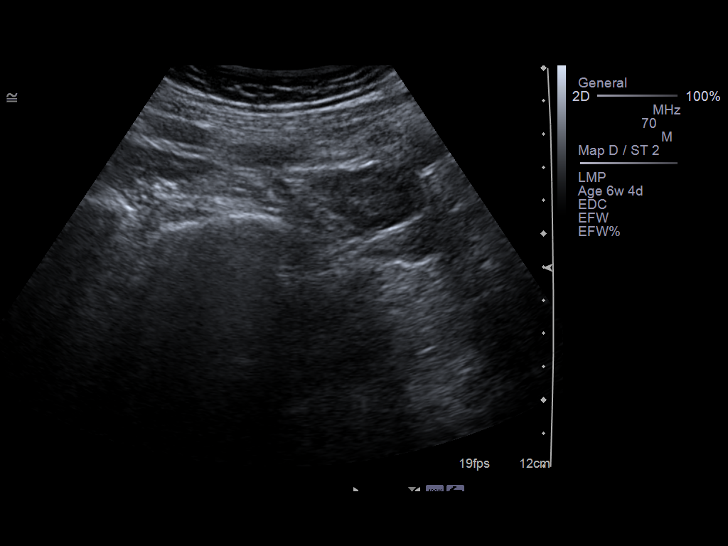
[im 22/64]
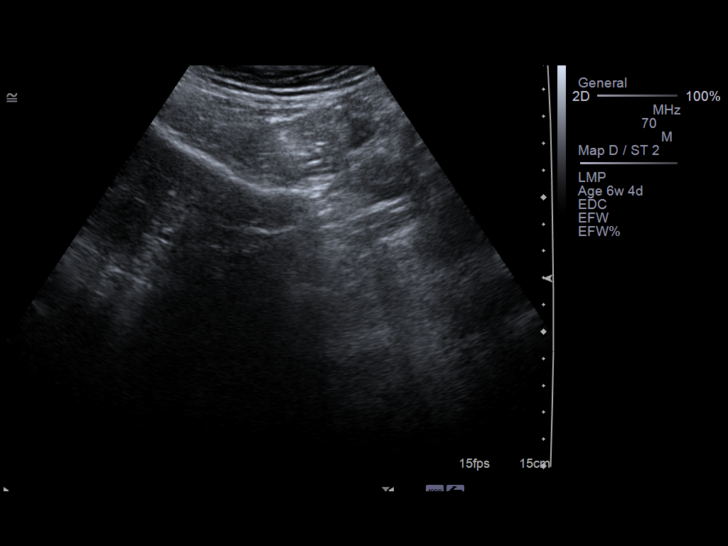
[im 26/64]
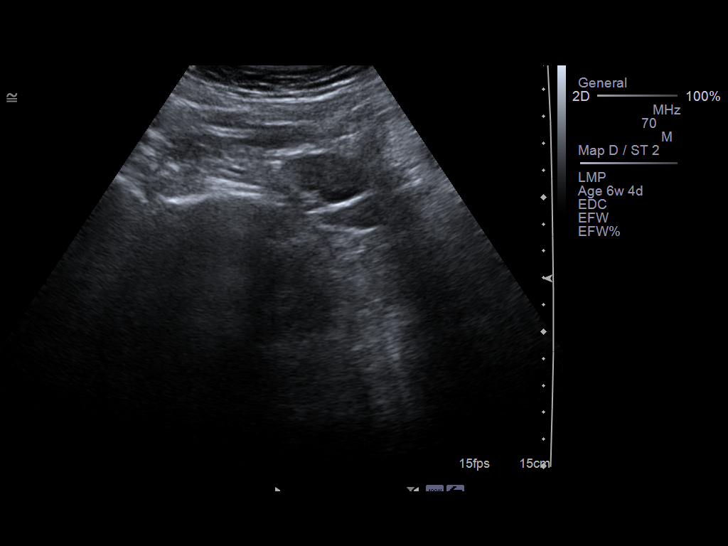
[im 31/64]
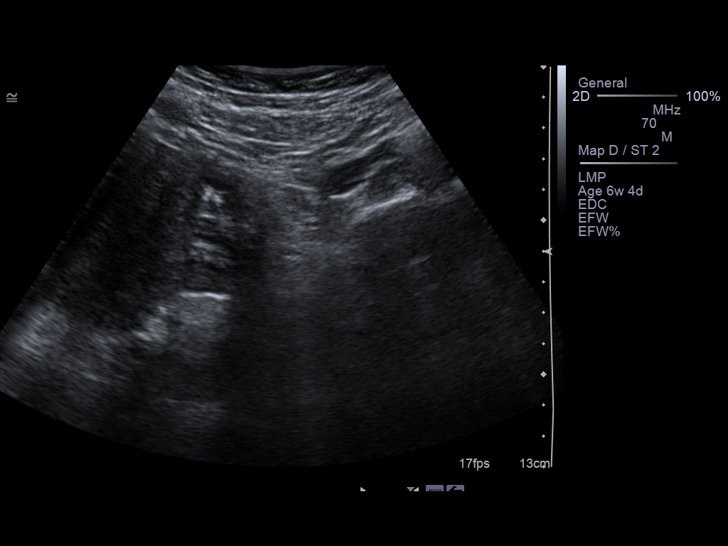
[im 36/64]
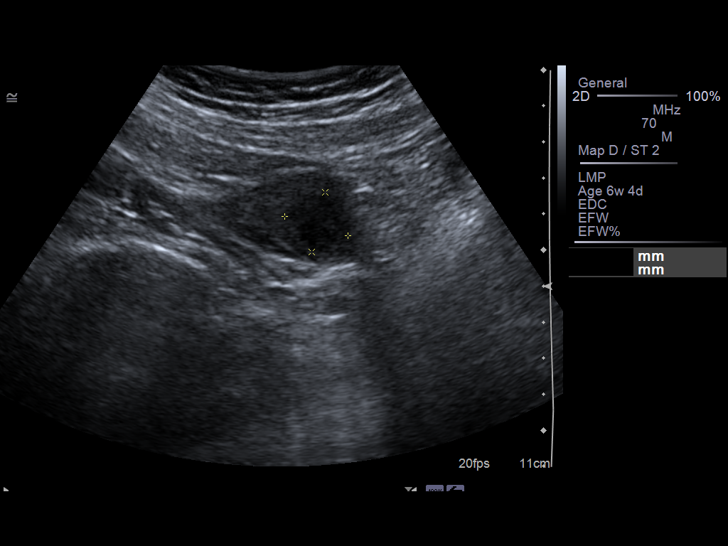
[im 40/64]
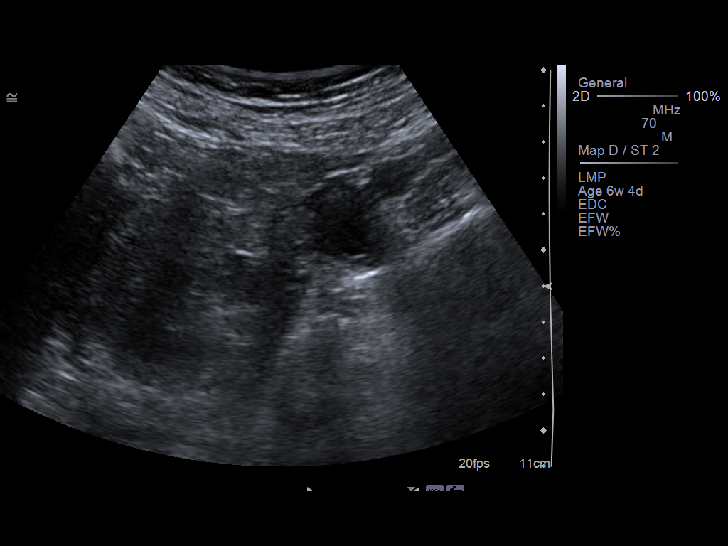
[im 45/64]
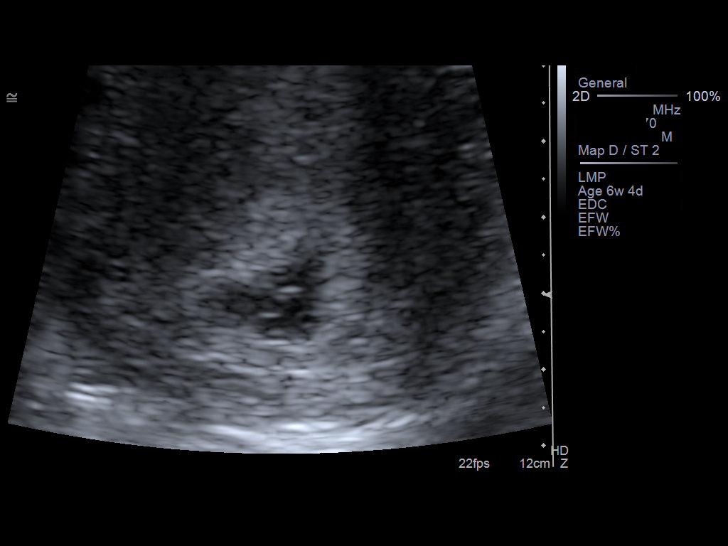
[im 50/64]
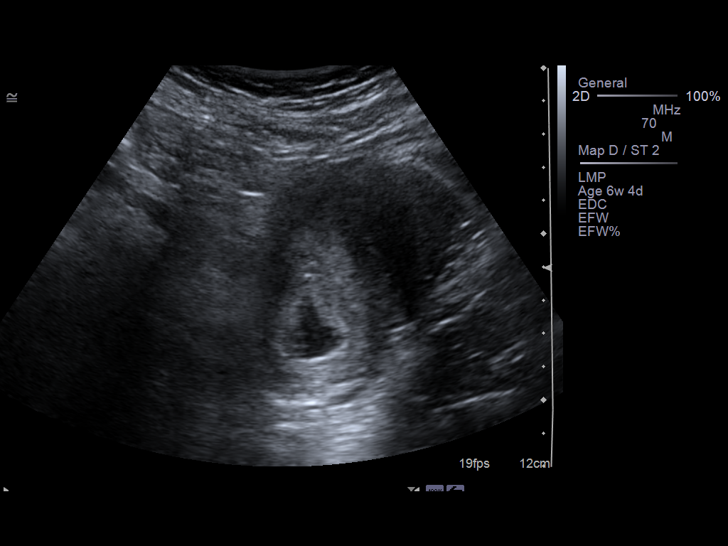
[im 54/64]
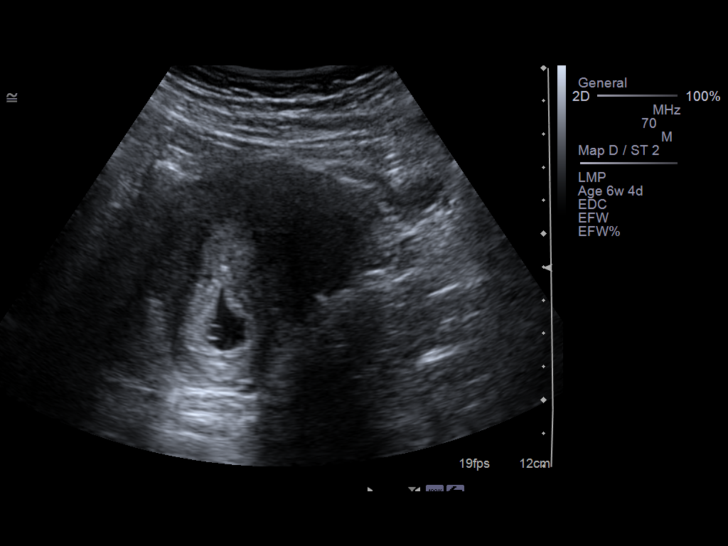
[im 59/64]
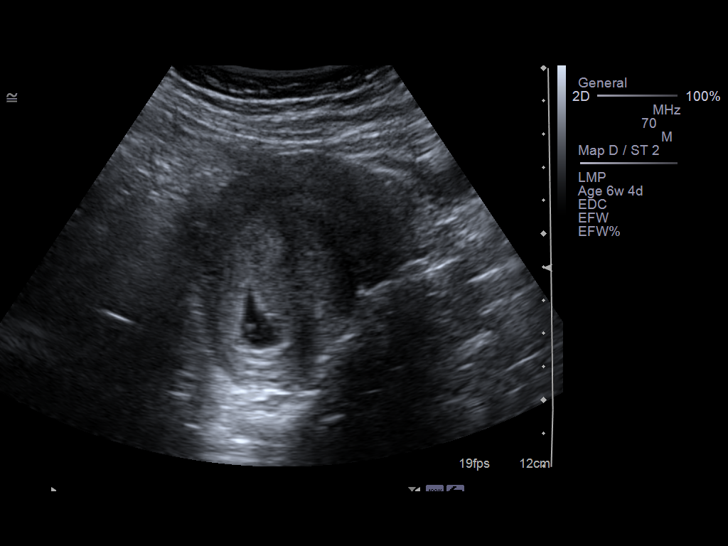
[im 64/64]
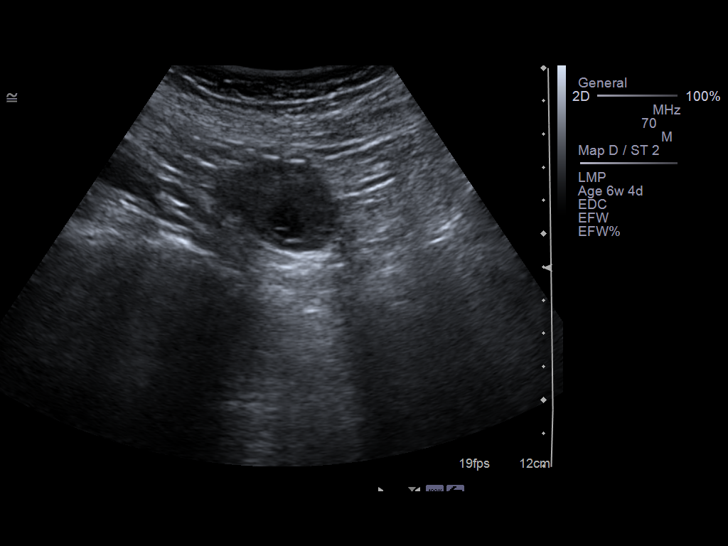

[14 of 28 positions shown; findings below may reference images not displayed]

FINDINGS: There is a single intrauterine gestation.  Crown-rump
length is 5 mm for an estimated gestational age of 6 weeks 2 days.
Fetal heart rate 118 beats per minute.  No subchorionic hemorrhage.

Right ovary unremarkable.  Small left corpus luteal cyst.  No free
fluid.
IMPRESSION: Single intrauterine pregnancy, 6 weeks 2 days with fetal heart rate
118 beats per minute.

## 2015-08-14 IMAGING — US US OB COMP +14 WK
2 series · 12 of 28 positions shown · non-contrast
Comparison: none

[Series 1: us ob comp +14 wk · 69 acquisitions, 11 frames shown (1 of 2)]
[im 3/69]
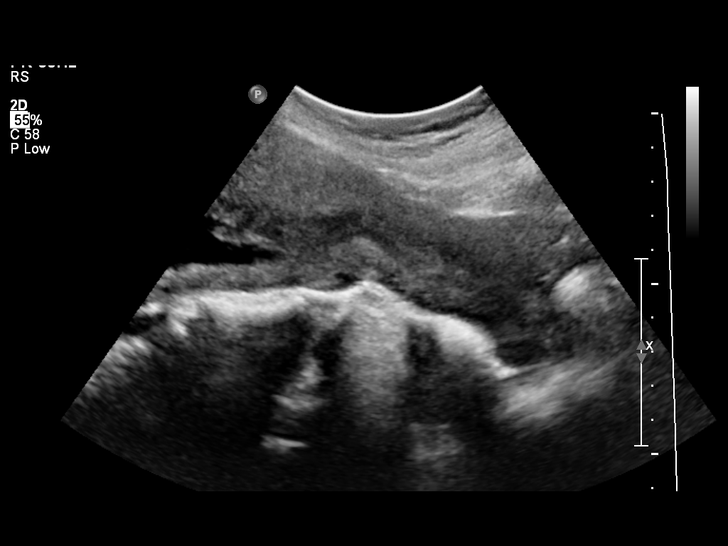
[im 9/69]
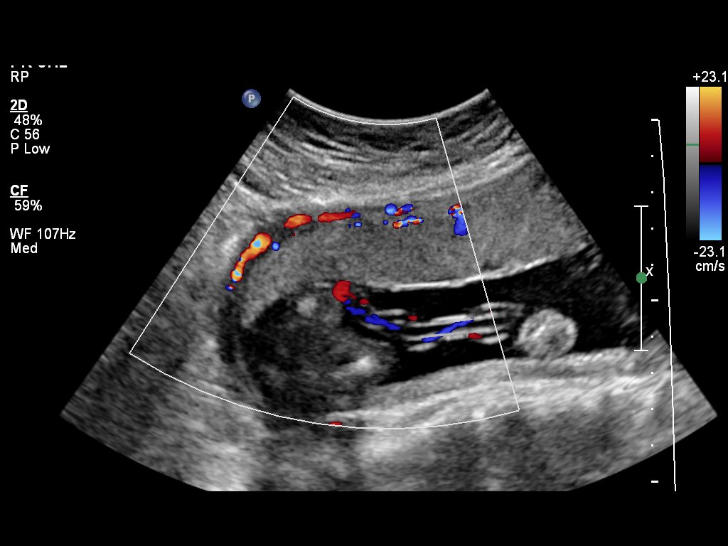
[im 15/69]
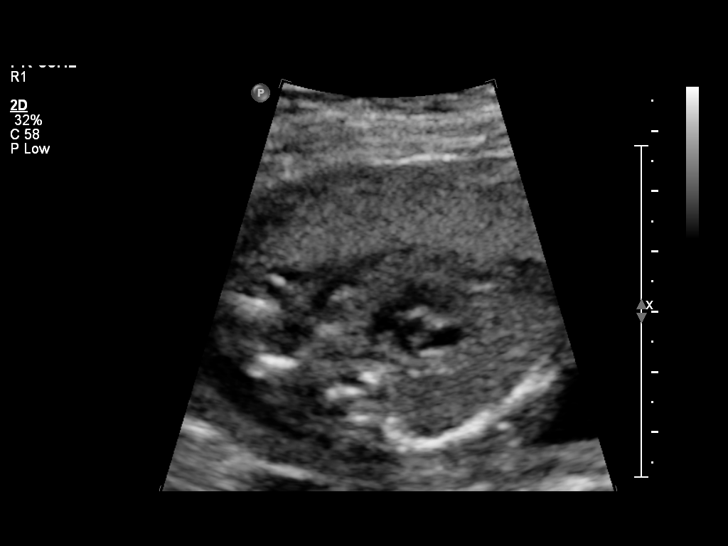
[im 23/69]
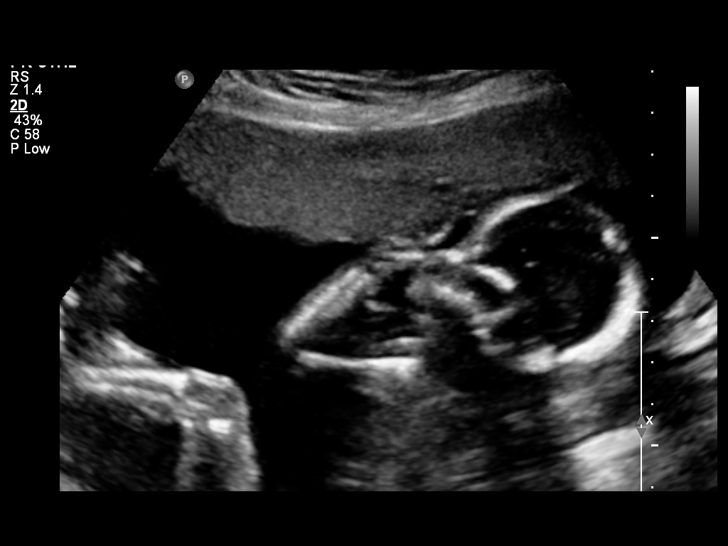
[im 29/69]
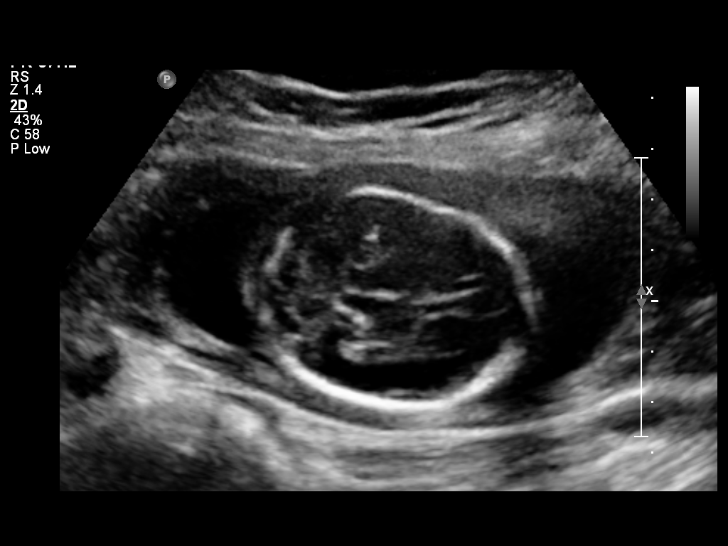
[im 35/69]
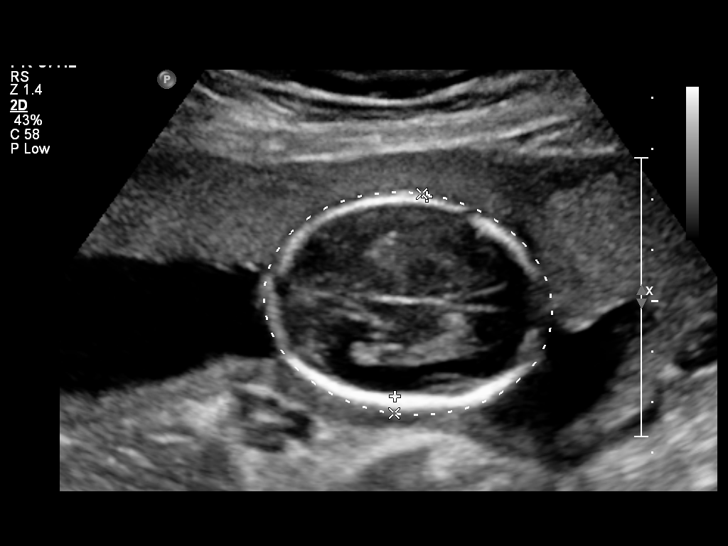
[im 43/69]
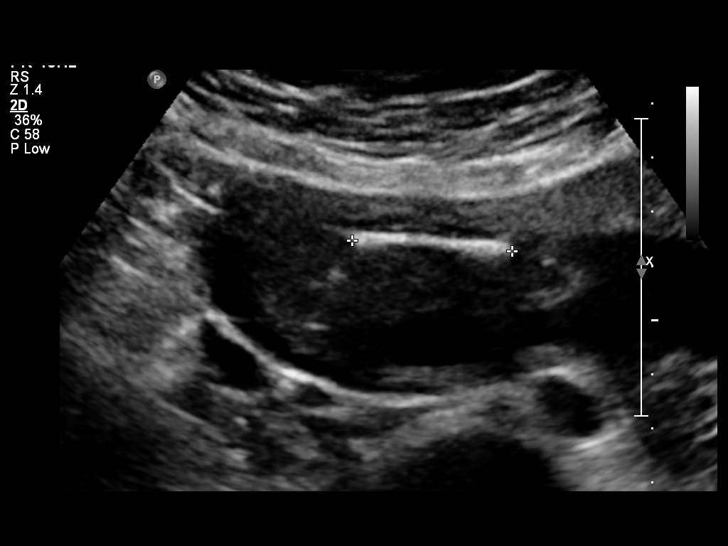
[im 49/69]
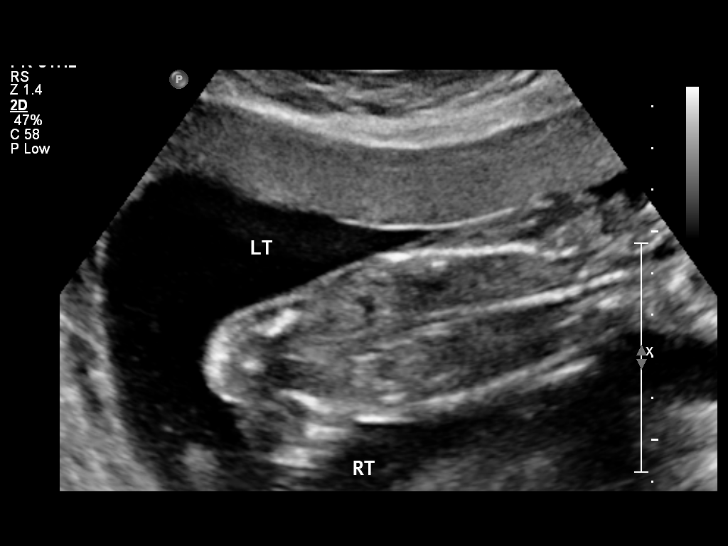
[im 54/69]
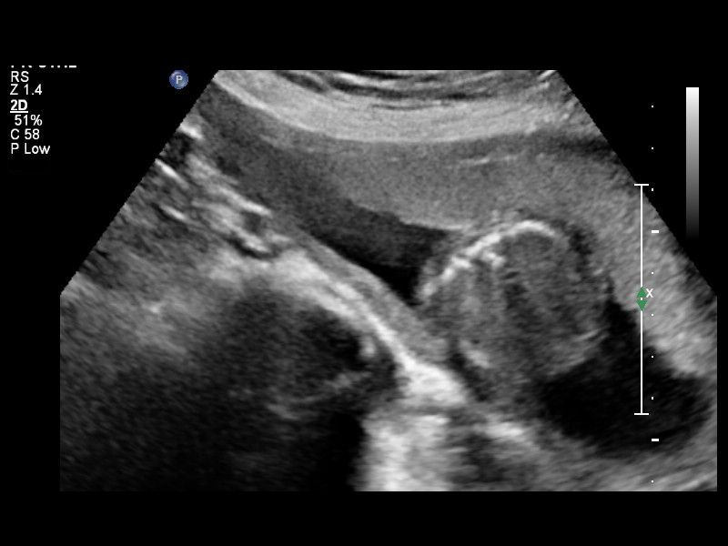
[im 63/69]
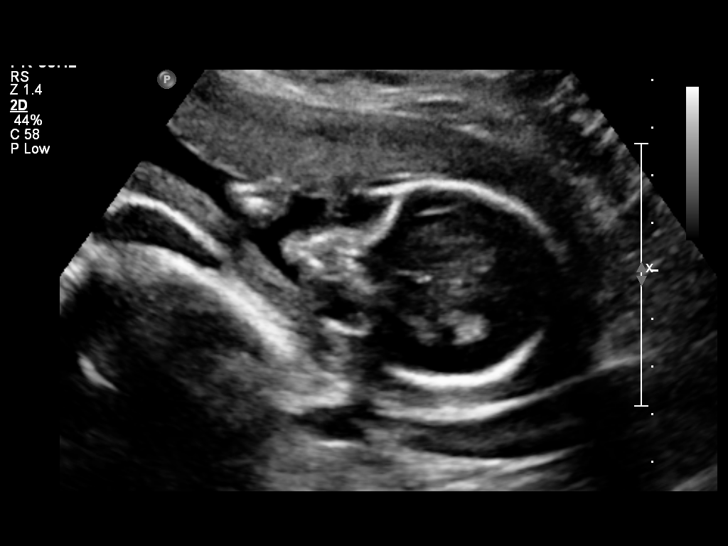
[im 69/69]
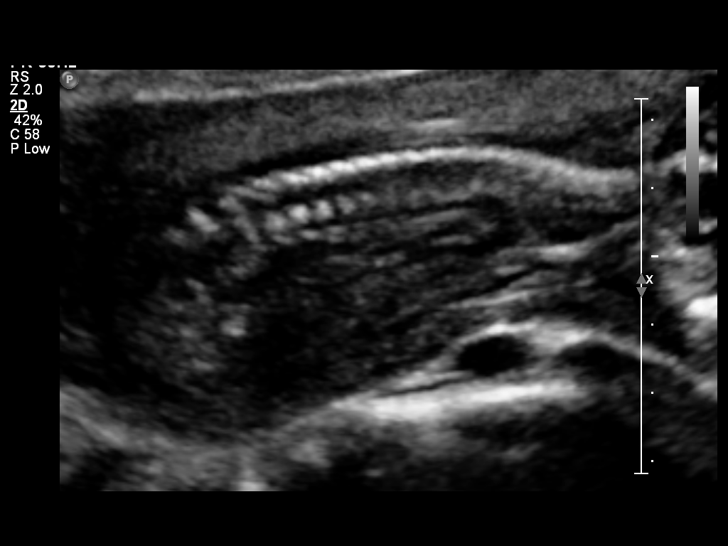

[Series 1: us ob comp +14 wk · 1 of 7 slices shown (2 of 2)]
[im 4/7]
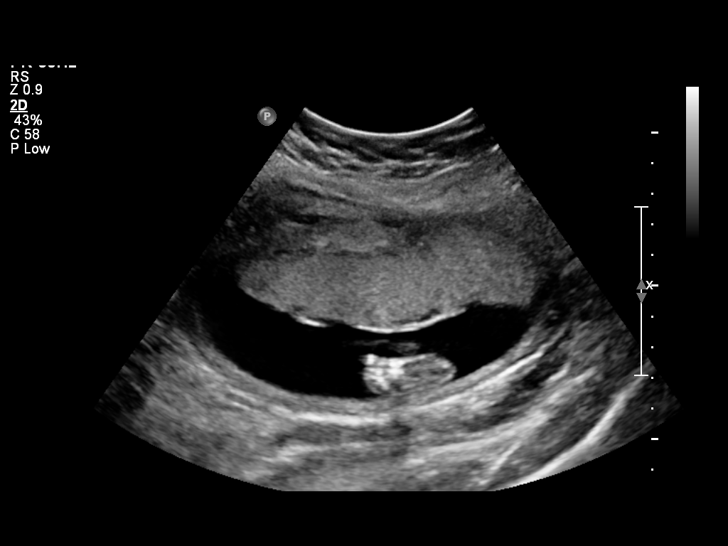

[12 of 28 positions shown; findings below may reference images not displayed]

OBSTETRICS REPORT
                      (Signed Final 05/15/2013 [DATE])

Service(s) Provided

 US OB COMP + 14 WK                                    76805.1
Indications

 Basic anatomic survey
Fetal Evaluation

 Num Of Fetuses:    1
 Fetal Heart Rate:  144                          bpm
 Cardiac Activity:  Observed
 Presentation:      Transverse, head to
                    maternal left
 Placenta:          Anterior, above cervical os
 P. Cord            Visualized, central
 Insertion:

 Amniotic Fluid
 AFI FV:      Subjectively within normal limits
                                             Larg Pckt:    4.59  cm
 LLQ:   4.59    cm
Biometry

 BPD:     39.5  mm     G. Age:  18w 0d                CI:        64.61   70 - 86
                                                      FL/HC:      18.7   16.1 -

 HC:       158  mm     G. Age:  18w 5d       34  %    HC/AC:      1.15   1.09 -

 AC:     136.8  mm     G. Age:  19w 1d       55  %    FL/BPD:
 FL:      29.6  mm     G. Age:  19w 1d       53  %    FL/AC:      21.6   20 - 24
 HUM:     27.1  mm     G. Age:  18w 4d       45  %
 NFT:     2.56  mm

 Est. FW:     271  gm    0 lb 10 oz      49  %
Gestational Age

 U/S Today:     18w 5d                                        EDD:   10/11/13
 Best:          18w 6d     Det. By:  Early Ultrasound         EDD:   10/10/13
Anatomy
 Cranium:          Appears normal         Aortic Arch:      Appears normal
 Fetal Cavum:      Appears normal         Diaphragm:        Appears normal
 Ventricles:       Appears normal         Stomach:          Appears normal, left
                                                            sided
 Choroid Plexus:   Appears normal         Abdomen:          Appears normal
 Cerebellum:       Appears normal         Abdominal Wall:   Appears nml (cord
                                                            insert, abd wall)
 Posterior Fossa:  Appears normal         Cord Vessels:     Appears normal (3
                                                            vessel cord)
 Nuchal Fold:      Appears normal         Kidneys:          Appear normal
 Face:             Appears normal         Bladder:          Appears normal
                   (orbits and profile)
 Lips:             Appears normal         Spine:            Appears normal
 Heart:            Appears normal         Lower             Appears normal
                   (4CH, axis, and        Extremities:
                   situs)
 RVOT:             Appears normal         Upper             Appears normal
                                          Extremities:
 LVOT:             Appears normal

 Other:  Fetus appears to be a female.
Cervix Uterus Adnexa

 Cervical Length:    4.1      cm

 Cervix:       Normal appearance by transabdominal scan.
 Uterus:       No abnormality visualized.

 Left Ovary:    No adnexal mass visualized.
 Right Ovary:   No adnexal mass visualized.
 Adnexa:     No abnormality visualized.
Impression

 Single IUP at 18 [DATE] weeks
 Normal fetal anatomic survey
 No markers associated with aneuploidy noted
 Anterior placenta without previa
 Normal amniotic fluid volume
Recommendations

 Follow-up ultrasounds as clinically indicated.

## 2018-10-30 ENCOUNTER — Telehealth: Payer: Self-pay

## 2018-10-30 NOTE — Telephone Encounter (Signed)
Pt called thru Nurse Line on 10/27/18 @ 1214 stating that she had an IUD inserted in May of 2015 at clinic, is having pain w/ incident on 10/26/18. Feels that the IUD has shifted. Wanted to know if she's still a pt with [place]. Her contact# is [phone].

## 2018-10-31 NOTE — Telephone Encounter (Signed)
Pt reports severe abdominal pain that occurred with rough sex that has since gone away. Pt denies bleeding.  Pt advised to contact the office if the severe abdominal pain returns or if any abdominal pain returns and remains constant.  Pt verbalized understanding.  Pt requesting to make an appointment for a few weeks from now to have her IUD removed d/t it being about 29 years old now.  Will send message to front office staff.

## 2018-11-03 ENCOUNTER — Telehealth: Payer: Self-pay | Admitting: Family Medicine

## 2018-11-03 NOTE — Telephone Encounter (Signed)
Called patient, but the young man that answered the phone stated I had the wrong number.

## 2020-06-10 ENCOUNTER — Ambulatory Visit
Admission: EM | Admit: 2020-06-10 | Discharge: 2020-06-10 | Disposition: A | Discharge status: Home / Self Care | Payer: 59 | Attending: Emergency Medicine | Admitting: Emergency Medicine

## 2020-06-10 ENCOUNTER — Encounter: Payer: Self-pay | Admitting: Emergency Medicine

## 2020-06-10 ENCOUNTER — Other Ambulatory Visit: Payer: Self-pay

## 2020-06-10 DIAGNOSIS — R059 Cough, unspecified: Secondary | ICD-10-CM | POA: Diagnosis not present

## 2020-06-10 MED ORDER — ALBUTEROL SULFATE HFA 108 (90 BASE) MCG/ACT IN AERS: 1.0000 | INHALATION_SPRAY | Freq: Four times a day (QID) | RESPIRATORY_TRACT | 0 refills | Status: DC | PRN

## 2020-06-10 MED ORDER — BENZONATATE 100 MG PO CAPS: 100.0000 mg | ORAL_CAPSULE | Freq: Three times a day (TID) | ORAL | 0 refills | Status: DC

## 2020-06-10 MED ORDER — PREDNISONE 10 MG PO TABS: 20.0000 mg | ORAL_TABLET | Freq: Every day | ORAL | 0 refills | Status: DC

## 2020-06-10 NOTE — Discharge Instructions (Signed)
Get plenty of rest and push fluids Tessalon Perles prescribed for cough Albuterol inhaler was prescribed Prednisone was prescribed Use medications daily for symptom relief Use OTC medications like ibuprofen or tylenol as needed fever or pain Call or go to the ED if you have any new or worsening symptoms such as fever, worsening cough, shortness of breath, chest tightness, chest pain, turning blue, changes in mental status, etc..Marland Kitchen

## 2020-06-10 NOTE — ED Provider Notes (Signed)
[facility]   [id number] 06/10/20 Arrival Time: 1232   Chief Complaint  Patient presents with  . Cough     SUBJECTIVE: History from: patient and family.  [name] [name] is a 29 y.o. female presented to the urgent care for complaint of cough for the past 6 days.  Has sibling with the same symptom that tested negative for COVID-19.  Denies recent travel.  Has tried OTC medication without relief.  Denies aggravating factors.  Denies previous symptoms in the past.   Denies fever, chills, fatigue, sinus pain, rhinorrhea, sore throat, SOB, wheezing, chest pain, nausea, changes in bowel or bladder habits.     ROS: As per HPI.  All other pertinent ROS negative.     Past Medical History:  Diagnosis Date  . Breast feeding status of mother   . Migraine    Past Surgical History:  Procedure Laterality Date  . TONSILLECTOMY     No Known Allergies No current facility-administered medications on file prior to encounter.   Current Outpatient Medications on File Prior to Encounter  Medication Sig Dispense Refill  . loperamide (IMODIUM) 2 MG capsule Take 1 capsule (2 mg total) by mouth 4 (four) times daily as needed for diarrhea or loose stools. 12 capsule 0  . ondansetron (ZOFRAN ODT) 4 MG disintegrating tablet Take 1 tablet (4 mg total) by mouth every 8 (eight) hours as needed for nausea or vomiting. 20 tablet 0   Social History   Socioeconomic History  . Marital status: Single    Spouse name: Not on file  . Number of children: Not on file  . Years of education: Not on file  . Highest education level: Not on file  Occupational History  . Not on file  Tobacco Use  . Smoking status: Current Some Day Smoker  . Smokeless tobacco: Never Used  Substance and Sexual Activity  . Alcohol use: Yes    Comment: occ  . Drug use: No  . Sexual activity: Yes    Birth control/protection: I.U.D.  Other Topics Concern  . Not on file  Social History Narrative  . Not on file    Social Determinants of Health   Financial Resource Strain:   . Difficulty of Paying Living Expenses: Not on file  Food Insecurity:   . Worried About Programme researcher, broadcasting/film/video in the Last Year: Not on file  . Ran Out of Food in the Last Year: Not on file  Transportation Needs:   . Lack of Transportation (Medical): Not on file  . Lack of Transportation (Non-Medical): Not on file  Physical Activity:   . Days of Exercise per Week: Not on file  . Minutes of Exercise per Session: Not on file  Stress:   . Feeling of Stress : Not on file  Social Connections:   . Frequency of Communication with Friends and Family: Not on file  . Frequency of Social Gatherings with Friends and Family: Not on file  . Attends Religious Services: Not on file  . Active Member of Clubs or Organizations: Not on file  . Attends Banker Meetings: Not on file  . Marital Status: Not on file  Intimate Partner Violence:   . Fear of Current or Ex-Partner: Not on file  . Emotionally Abused: Not on file  . Physically Abused: Not on file  . Sexually Abused: Not on file   Family History  Problem Relation Age of Onset  . Cancer Maternal Grandmother   . Hypertension  Paternal Grandmother   . Diabetes Paternal Grandmother     OBJECTIVE:  Vitals:   06/10/20 1329 06/10/20 1331  BP: 127/79   Pulse: 88   Resp: 18   Temp: 98.9 F (37.2 C)   TempSrc: Oral   SpO2: 98%   Weight:  180 lb (81.6 kg)  Height:  5\' 4"  (1.626 m)     General appearance: alert; appears fatigued, but nontoxic; speaking in full sentences and tolerating own secretions HEENT: NCAT; Ears: EACs clear, TMs pearly gray; Eyes: PERRL.  EOM grossly intact. Sinuses: nontender; Nose: nares patent without rhinorrhea, Throat: oropharynx clear, tonsils non erythematous or enlarged, uvula midline  Neck: supple without LAD Lungs: unlabored respirations, symmetrical air entry; cough: moderate; no respiratory distress; CTAB Heart: regular rate and  rhythm.  Radial pulses 2+ symmetrical bilaterally Skin: warm and dry Psychological: alert and cooperative; normal mood and affect  LABS:  No results found for this or any previous visit (from the past 24 hour(s)).   ASSESSMENT & PLAN:  1. Cough     Meds ordered this encounter  Medications  . benzonatate (TESSALON) 100 MG capsule    Sig: Take 1 capsule (100 mg total) by mouth every 8 (eight) hours.    Dispense:  30 capsule    Refill:  0  . albuterol (VENTOLIN HFA) 108 (90 Base) MCG/ACT inhaler    Sig: Inhale 1-2 puffs into the lungs every 6 (six) hours as needed for wheezing or shortness of breath.    Dispense:  18 g    Refill:  0  . predniSONE (DELTASONE) 10 MG tablet    Sig: Take 2 tablets (20 mg total) by mouth daily.    Dispense:  15 tablet    Refill:  0    Discharge Instructions  Get plenty of rest and push fluids Tessalon Perles prescribed for cough Albuterol inhaler was prescribed Prednisone was prescribed Use medications daily for symptom relief Use OTC medications like ibuprofen or tylenol as needed fever or pain Call or go to the ED if you have any new or worsening symptoms such as fever, worsening cough, shortness of breath, chest tightness, chest pain, turning blue, changes in mental status, etc...   Reviewed expectations re: course of current medical issues. Questions answered. Outlined signs and symptoms indicating need for more acute intervention. Patient verbalized understanding. After Visit Summary given.         , FNP 06/10/20 1423

## 2020-06-10 NOTE — ED Triage Notes (Signed)
Cough that started last Thursday.  Pt has tried mucinex and expectorant with no relief.  States today she coughed so much she threw up. Pt daughter had similar s/s over a week ago and was neg for covid.

## 2020-10-31 ENCOUNTER — Other Ambulatory Visit: Payer: Self-pay

## 2020-10-31 ENCOUNTER — Emergency Department (HOSPITAL_BASED_OUTPATIENT_CLINIC_OR_DEPARTMENT_OTHER)
Admission: EM | Admit: 2020-10-31 | Discharge: 2020-10-31 | Discharge status: Against Medical Advice | Payer: 59 | Attending: Emergency Medicine | Admitting: Emergency Medicine

## 2020-10-31 ENCOUNTER — Encounter (HOSPITAL_BASED_OUTPATIENT_CLINIC_OR_DEPARTMENT_OTHER): Payer: Self-pay

## 2020-10-31 DIAGNOSIS — R110 Nausea: Secondary | ICD-10-CM | POA: Insufficient documentation

## 2020-10-31 DIAGNOSIS — M549 Dorsalgia, unspecified: Secondary | ICD-10-CM | POA: Diagnosis not present

## 2020-10-31 DIAGNOSIS — Z5321 Procedure and treatment not carried out due to patient leaving prior to being seen by health care provider: Secondary | ICD-10-CM | POA: Diagnosis not present

## 2020-10-31 DIAGNOSIS — R109 Unspecified abdominal pain: Secondary | ICD-10-CM | POA: Diagnosis present

## 2020-10-31 NOTE — ED Triage Notes (Signed)
Emergency Medicine Provider Triage Evaluation Note  [name] [name] , a 30 y.o. female  was evaluated in triage.  Pt complains of upper abd pain, hx of gallbladder dz.  Review of Systems  Positive: abd pain, nausea Negative: Vomiting, fever  Physical Exam  BP 120/86 (BP Location: Left Arm)   Pulse 62   Temp 98.3 F (36.8 C) (Oral)   Resp 18   Ht 5\' 4"  (1.626 m)   Wt 79.4 kg   SpO2 100%   BMI 30.04 kg/m  Gen:   Awake, no distress    HEENT:  Atraumatic   Resp:  Normal effort   Cardiac:  Normal rate   Abd:   Nondistended, tender to upper abd MSK:   Moves extremities without difficulty  Neuro:  Speech clear   Medical Decision Making  Medically screening exam initiated at 7:39 PM.  Appropriate orders placed.  [name] [name] was informed that the remainder of the evaluation will be completed by another provider, this initial triage assessment does not replace that evaluation, and the importance of remaining in the ED until their evaluation is complete.  Clinical Impression  abd pain   [name] Carnes, MD 10/31/20 1940

## 2020-10-31 NOTE — ED Triage Notes (Signed)
Pt arrived via POV for abdominal pain and back pain on the right side. Pt described it as going to dull to sharp pain over the last hour. Pt is nauseated no vomiting.

## 2022-07-04 ENCOUNTER — Telehealth: Payer: Self-pay | Admitting: Physician Assistant

## 2022-07-04 DIAGNOSIS — J208 Acute bronchitis due to other specified organisms: Secondary | ICD-10-CM

## 2022-07-04 DIAGNOSIS — B9689 Other specified bacterial agents as the cause of diseases classified elsewhere: Secondary | ICD-10-CM

## 2022-07-04 MED ORDER — PROMETHAZINE-DM 6.25-15 MG/5ML PO SYRP: 5.0000 mL | ORAL_SOLUTION | Freq: Four times a day (QID) | ORAL | 0 refills | Status: DC | PRN

## 2022-07-04 MED ORDER — ALBUTEROL SULFATE HFA 108 (90 BASE) MCG/ACT IN AERS: 1.0000 | INHALATION_SPRAY | Freq: Four times a day (QID) | RESPIRATORY_TRACT | 0 refills | Status: DC | PRN

## 2022-07-04 MED ORDER — BENZONATATE 100 MG PO CAPS: 100.0000 mg | ORAL_CAPSULE | Freq: Three times a day (TID) | ORAL | 0 refills | Status: DC | PRN

## 2022-07-04 MED ORDER — AZITHROMYCIN 250 MG PO TABS: ORAL_TABLET | ORAL | 0 refills | Status: AC

## 2022-07-04 MED ORDER — PREDNISONE 20 MG PO TABS: 40.0000 mg | ORAL_TABLET | Freq: Every day | ORAL | 0 refills | Status: DC

## 2022-07-04 NOTE — Patient Instructions (Signed)
[name] [name], thank you for joining [name] Loveless, PA-C for today's virtual visit.  While this provider is not your primary care provider (PCP), if your PCP is located in our provider database this encounter information will be shared with them immediately following your visit.   A [name] MyChart account gives you access to today's visit and all your visits, tests, and labs performed at [facility] " click here if you don't have a [facility] MyChart account or go to [email or web address]  Consent: (Patient) [name] [name] provided verbal consent for this virtual visit at the beginning of the encounter.  Current Medications:  Current Outpatient Medications:    albuterol (VENTOLIN HFA) 108 (90 Base) MCG/ACT inhaler, Inhale 1-2 puffs into the lungs every 6 (six) hours as needed., Disp: 8 g, Rfl: 0   azithromycin (ZITHROMAX) 250 MG tablet, Take 2 tablets on day 1, then 1 tablet daily on days 2 through 5, Disp: 6 tablet, Rfl: 0   benzonatate (TESSALON) 100 MG capsule, Take 1 capsule (100 mg total) by mouth 3 (three) times daily as needed., Disp: 30 capsule, Rfl: 0   predniSONE (DELTASONE) 20 MG tablet, Take 2 tablets (40 mg total) by mouth daily with breakfast., Disp: 10 tablet, Rfl: 0   promethazine-dextromethorphan (PROMETHAZINE-DM) 6.25-15 MG/5ML syrup, Take 5 mLs by mouth 4 (four) times daily as needed., Disp: 118 mL, Rfl: 0   loperamide (IMODIUM) 2 MG capsule, Take 1 capsule (2 mg total) by mouth 4 (four) times daily as needed for diarrhea or loose stools., Disp: 12 capsule, Rfl: 0   ondansetron (ZOFRAN ODT) 4 MG disintegrating tablet, Take 1 tablet (4 mg total) by mouth every 8 (eight) hours as needed for nausea or vomiting., Disp: 20 tablet, Rfl: 0   Medications ordered in this encounter:  Meds ordered this encounter  Medications   azithromycin (ZITHROMAX) 250 MG tablet    Sig: Take 2 tablets on day 1, then 1 tablet daily on days 2 through 5    Dispense:   6 tablet    Refill:  0    Order Specific Question:   Supervising Provider    Answer:   Merrilee Jansky [[id number]]   predniSONE (DELTASONE) 20 MG tablet    Sig: Take 2 tablets (40 mg total) by mouth daily with breakfast.    Dispense:  10 tablet    Refill:  0    Order Specific Question:   Supervising Provider    Answer:   Merrilee Jansky [[id number]]   albuterol (VENTOLIN HFA) 108 (90 Base) MCG/ACT inhaler    Sig: Inhale 1-2 puffs into the lungs every 6 (six) hours as needed.    Dispense:  8 g    Refill:  0    Order Specific Question:   Supervising Provider    Answer:   Merrilee Jansky [[id number]]   benzonatate (TESSALON) 100 MG capsule    Sig: Take 1 capsule (100 mg total) by mouth 3 (three) times daily as needed.    Dispense:  30 capsule    Refill:  0    Order Specific Question:   Supervising Provider    Answer:   Merrilee Jansky [id number]   promethazine-dextromethorphan (PROMETHAZINE-DM) 6.25-15 MG/5ML syrup    Sig: Take 5 mLs by mouth 4 (four) times daily as needed.    Dispense:  118 mL    Refill:  0    Order Specific Question:   Supervising Provider    Answer:  Merrilee Jansky [[id number]]     *If you need refills on other medications prior to your next appointment, please contact your pharmacy*  Follow-Up: Call back or seek an in-person evaluation if the symptoms worsen or if the condition fails to improve as anticipated.  [facility] [phone]  Other Instructions Acute Bronchitis, Adult  Acute bronchitis is sudden inflammation of the main airways (bronchi) that come off the windpipe (trachea) in the lungs. The swelling causes the airways to get smaller and make more mucus than normal. This can make it hard to breathe and can cause coughing or noisy breathing (wheezing). Acute bronchitis may last several weeks. The cough may last longer. Allergies, asthma, and exposure to smoke may make the condition worse. What are the causes? This condition can be  caused by germs and by substances that irritate the lungs, including: Cold and flu viruses. The most common cause of this condition is the virus that causes the common cold. Bacteria. This is less common. Breathing in substances that irritate the lungs, including: Smoke from cigarettes and other forms of tobacco. Dust and pollen. Fumes from household cleaning products, gases, or burned fuel. Indoor or outdoor air pollution. What increases the risk? The following factors may make you more likely to develop this condition: A weak body's defense system, also called the immune system. A condition that affects your lungs and breathing, such as asthma. What are the signs or symptoms? Common symptoms of this condition include: Coughing. This may bring up clear, yellow, or green mucus from your lungs (sputum). Wheezing. Runny or stuffy nose. Having too much mucus in your lungs (chest congestion). Shortness of breath. Aches and pains, including sore throat or chest. How is this diagnosed? This condition is usually diagnosed based on: Your symptoms and medical history. A physical exam. You may also have other tests, including tests to rule out other conditions, such as pneumonia. These tests include: A test of lung function. Test of a mucus sample to look for the presence of bacteria. Tests to check the oxygen level in your blood. Blood tests. Chest X-ray. How is this treated? Most cases of acute bronchitis clear up over time without treatment. Your health care provider may recommend: Drinking more fluids to help thin your mucus so it is easier to cough up. Taking inhaled medicine (inhaler) to improve air flow in and out of your lungs. Using a vaporizer or a humidifier. These are machines that add water to the air to help you breathe better. Taking a medicine that thins mucus and clears congestion (expectorant). Taking a medicine that prevents or stops coughing (cough suppressant). It is  not common to take an antibiotic medicine for this condition. Follow these instructions at home:  Take over-the-counter and prescription medicines only as told by your health care provider. Use an inhaler, vaporizer, or humidifier as told by your health care provider. Take two teaspoons (10 mL) of honey at bedtime to lessen coughing at night. Drink enough fluid to keep your urine pale yellow. Do not use any products that contain nicotine or tobacco. These products include cigarettes, chewing tobacco, and vaping devices, such as e-cigarettes. If you need help quitting, ask your health care provider. Get plenty of rest. Return to your normal activities as told by your health care provider. Ask your health care provider what activities are safe for you. Keep all follow-up visits. This is important. How is this prevented? To lower your risk of getting this condition again:  Wash your hands often with soap and water for at least 20 seconds. If soap and water are not available, use hand sanitizer. Avoid contact with people who have cold symptoms. Try not to touch your mouth, nose, or eyes with your hands. Avoid breathing in smoke or chemical fumes. Breathing smoke or chemical fumes will make your condition worse. Get the flu shot every year. Contact a health care provider if: Your symptoms do not improve after 2 weeks. You have trouble coughing up the mucus. Your cough keeps you awake at night. You have a fever. Get help right away if you: Cough up blood. Feel pain in your chest. Have severe shortness of breath. Faint or keep feeling like you are going to faint. Have a severe headache. Have a fever or chills that get worse. These symptoms may represent a serious problem that is an emergency. Do not wait to see if the symptoms will go away. Get medical help right away. Call your local emergency services (911 in the [place] Do not drive yourself to the hospital. Summary Acute bronchitis is  inflammation of the main airways (bronchi) that come off the windpipe (trachea) in the lungs. The swelling causes the airways to get smaller and make more mucus than normal. Drinking more fluids can help thin your mucus so it is easier to cough up. Take over-the-counter and prescription medicines only as told by your health care provider. Do not use any products that contain nicotine or tobacco. These products include cigarettes, chewing tobacco, and vaping devices, such as e-cigarettes. If you need help quitting, ask your health care provider. Contact a health care provider if your symptoms do not improve after 2 weeks. This information is not intended to replace advice given to you by your health care provider. Make sure you discuss any questions you have with your health care provider. Document Revised: 10/22/2021 Document Reviewed: 11/12/2020 Elsevier Patient Education  [place].    If you have been instructed to have an in-person evaluation today at a local Urgent Care facility, please use the link below. It will take you to a list of all of our available [facility], including address, phone number and hours of operation. Please do not delay care.  [facility]  If you or a family member do not have a primary care provider, use the link below to schedule a visit and establish care. When you choose a [facility] primary care physician or advanced practice provider, you gain a long-term partner in health. Find a Primary Care Provider  Learn more about [facility] in-office and virtual care options: [facility]
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
## 2022-07-06 ENCOUNTER — Other Ambulatory Visit: Payer: Self-pay

## 2022-07-06 ENCOUNTER — Emergency Department (HOSPITAL_BASED_OUTPATIENT_CLINIC_OR_DEPARTMENT_OTHER): Payer: BC Managed Care – PPO

## 2022-07-06 ENCOUNTER — Emergency Department (HOSPITAL_BASED_OUTPATIENT_CLINIC_OR_DEPARTMENT_OTHER)
Admission: EM | Admit: 2022-07-06 | Discharge: 2022-07-06 | Disposition: A | Discharge status: Home / Self Care | Payer: BC Managed Care – PPO | Attending: Emergency Medicine | Admitting: Emergency Medicine

## 2022-07-06 ENCOUNTER — Encounter (HOSPITAL_BASED_OUTPATIENT_CLINIC_OR_DEPARTMENT_OTHER): Payer: Self-pay | Admitting: Urology

## 2022-07-06 DIAGNOSIS — R059 Cough, unspecified: Secondary | ICD-10-CM | POA: Diagnosis not present

## 2022-07-06 DIAGNOSIS — J101 Influenza due to other identified influenza virus with other respiratory manifestations: Secondary | ICD-10-CM | POA: Insufficient documentation

## 2022-07-06 DIAGNOSIS — R091 Pleurisy: Secondary | ICD-10-CM

## 2022-07-06 DIAGNOSIS — R079 Chest pain, unspecified: Secondary | ICD-10-CM | POA: Diagnosis not present

## 2022-07-06 DIAGNOSIS — R0789 Other chest pain: Secondary | ICD-10-CM | POA: Diagnosis not present

## 2022-07-06 DIAGNOSIS — Z1152 Encounter for screening for COVID-19: Secondary | ICD-10-CM | POA: Insufficient documentation

## 2022-07-06 DIAGNOSIS — J111 Influenza due to unidentified influenza virus with other respiratory manifestations: Secondary | ICD-10-CM

## 2022-07-06 LAB — RESP PANEL BY RT-PCR (RSV, FLU A&B, COVID)  RVPGX2
Influenza A by PCR: NEGATIVE
Influenza B by PCR: POSITIVE — AB
Resp Syncytial Virus by PCR: NEGATIVE
SARS Coronavirus 2 by RT PCR: NEGATIVE

## 2022-07-06 LAB — BASIC METABOLIC PANEL
Anion gap: 11 (ref 5–15)
BUN: 10 mg/dL (ref 6–20)
CO2: 18 mmol/L — ABNORMAL LOW (ref 22–32)
Calcium: 9.1 mg/dL (ref 8.9–10.3)
Chloride: 108 mmol/L (ref 98–111)
Creatinine, Ser: 0.62 mg/dL (ref 0.44–1.00)
GFR, Estimated: >60 mL/min (ref >60)
Glucose, Bld: 90 mg/dL (ref 70–99)
Potassium: 3.5 mmol/L (ref 3.5–5.1)
Sodium: 137 mmol/L (ref 135–145)

## 2022-07-06 LAB — TROPONIN I (HIGH SENSITIVITY): Troponin I (High Sensitivity): 2 ng/L (ref <18)

## 2022-07-06 LAB — PREGNANCY, URINE: Preg Test, Ur: NEGATIVE

## 2022-07-06 LAB — D-DIMER, QUANTITATIVE: D-Dimer, Quant: <0.27 ug/mL-FEU (ref 0.00–0.50)

## 2022-07-06 NOTE — Discharge Instructions (Signed)
Keep yourself hydrated.  Use Tylenol Motrin as needed for aches and for fevers.  You declined Tamiflu today.  No evidence of heart attack or blood clot in the lung.  Return to the ED with difficulty breathing, chest pain, unable to eat or drink or any concerns

## 2022-07-06 NOTE — ED Provider Notes (Signed)
[facility] EMERGENCY DEPARTMENT Provider Note   CSN: [id number] Arrival date & time: 07/06/22  1652     History  Chief Complaint  Patient presents with   Cough    [name] [name] is a 31 y.o. female.  Patient diagnosed with bronchitis 2 days ago and was started on prednisone and Zithromax, Tessalon and albuterol.  She has had cough, congestion, sore throat and aches for about 8 days.  Does not think she has had a fever.  Since yesterday she has new sharp pain to her left sided rib cage there is with deep breathing and coughing.  Pain last for few seconds at a time.  States the pain only occurs when she breathes deep or coughs.  No history of blood clots.  No history of birth control use.  No abdominal pain, vomiting or diarrhea.  Denies chest pain.  She has been taking prednisone and Zithromax for the past 2 days.  Does not feel Tessalon albuterol is helping.  Compared to 2 days ago she feels the same but having the pleuritic left-sided chest pain which is new  The history is provided by the patient.  Cough Associated symptoms: rhinorrhea   Associated symptoms: no chest pain, no fever, no headaches and no myalgias        Home Medications Prior to Admission medications   Medication Sig Start Date End Date Taking? Authorizing Provider  albuterol (VENTOLIN HFA) 108 (90 Base) MCG/ACT inhaler Inhale 1-2 puffs into the lungs every 6 (six) hours as needed. 07/04/22   [name], PA-C  azithromycin (ZITHROMAX) 250 MG tablet Take 2 tablets on day 1, then 1 tablet daily on days 2 through 5 07/04/22 07/09/22  [name], PA-C  benzonatate (TESSALON) 100 MG capsule Take 1 capsule (100 mg total) by mouth 3 (three) times daily as needed. 07/04/22   [name], PA-C  loperamide (IMODIUM) 2 MG capsule Take 1 capsule (2 mg total) by mouth 4 (four) times daily as needed for diarrhea or loose stools. 03/09/14   Piepenbrink, Victorino Dike, PA-C  ondansetron (ZOFRAN  ODT) 4 MG disintegrating tablet Take 1 tablet (4 mg total) by mouth every 8 (eight) hours as needed for nausea or vomiting. 03/09/14   Piepenbrink, Victorino Dike, PA-C  predniSONE (DELTASONE) 20 MG tablet Take 2 tablets (40 mg total) by mouth daily with breakfast. 07/04/22   [name], PA-C  promethazine-dextromethorphan (PROMETHAZINE-DM) 6.25-15 MG/5ML syrup Take 5 mLs by mouth 4 (four) times daily as needed. 07/04/22   [name], PA-C      Allergies    Patient has no known allergies.    Review of Systems   Review of Systems  Constitutional:  Positive for activity change, appetite change and fatigue. Negative for fever.  HENT:  Positive for congestion and rhinorrhea.   Respiratory:  Positive for cough and chest tightness.   Cardiovascular:  Negative for chest pain.  Gastrointestinal:  Negative for abdominal pain, nausea and vomiting.  Genitourinary:  Negative for dysuria and hematuria.  Musculoskeletal:  Negative for arthralgias and myalgias.  Neurological:  Negative for dizziness, weakness and headaches.   all other systems are negative except as noted in the HPI and PMH.    Physical Exam Updated Vital Signs BP (!) 143/93 (BP Location: Left Arm)   Pulse 65   Temp 98 F (36.7 C)   Resp 18   Ht 5\' 4"  (1.626 m)   Wt 79.4 kg   SpO2 95%  BMI 30.05 kg/m  Physical Exam Vitals and nursing note reviewed.  Constitutional:      General: She is not in acute distress.    Appearance: She is well-developed.  HENT:     Head: Normocephalic and atraumatic.     Mouth/Throat:     Pharynx: No oropharyngeal exudate.  Eyes:     Conjunctiva/sclera: Conjunctivae normal.     Pupils: Pupils are equal, round, and reactive to light.  Neck:     Comments: No meningismus. Cardiovascular:     Rate and Rhythm: Normal rate and regular rhythm.     Heart sounds: Normal heart sounds. No murmur heard. Pulmonary:     Effort: Pulmonary effort is normal. No respiratory distress.      Breath sounds: Normal breath sounds. No wheezing.     Comments: Rolly Salter RN present.  Tenderness across the lower rib cage on the left side.  No crepitus or ecchymosis Chest:     Chest wall: Tenderness present.  Abdominal:     Palpations: Abdomen is soft.     Tenderness: There is no abdominal tenderness. There is no guarding or rebound.  Musculoskeletal:        General: No tenderness. Normal range of motion.     Cervical back: Normal range of motion and neck supple.  Skin:    General: Skin is warm.  Neurological:     Mental Status: She is alert and oriented to person, place, and time.     Cranial Nerves: No cranial nerve deficit.     Motor: No abnormal muscle tone.     Coordination: Coordination normal.     Comments:  5/5 strength throughout. CN 2-12 intact.Equal grip strength.   Psychiatric:        Behavior: Behavior normal.     ED Results / Procedures / Treatments   Labs (all labs ordered are listed, but only abnormal results are displayed) Labs Reviewed  RESP PANEL BY RT-PCR (RSV, FLU A&B, COVID)  RVPGX2 - Abnormal; Notable for the following components:      Result Value   Influenza B by PCR POSITIVE (*)    All other components within normal limits  BASIC METABOLIC PANEL - Abnormal; Notable for the following components:   CO2 18 (*)    All other components within normal limits  PREGNANCY, URINE  D-DIMER, QUANTITATIVE  TROPONIN I (HIGH SENSITIVITY)  TROPONIN I (HIGH SENSITIVITY)    EKG None  Radiology DG Chest 2 View  Result Date: 07/06/2022 CLINICAL DATA:  Cough with chest  pain EXAM: CHEST - 2 VIEW COMPARISON:  None Available. FINDINGS: The heart size and mediastinal contours are within normal limits. Both lungs are clear. The visualized skeletal structures are unremarkable. IMPRESSION: No active cardiopulmonary disease. Electronically Signed   By: Marlan Palau M.D.   On: 07/06/2022 17:30    Procedures Procedures    Medications Ordered in ED Medications - No  data to display  ED Course/ Medical Decision Making/ A&P                           Medical Decision Making Amount and/or Complexity of Data Reviewed Labs: ordered. Decision-making details documented in ED Course. Radiology: ordered and independent interpretation performed. Decision-making details documented in ED Course. ECG/medicine tests: ordered and independent interpretation performed. Decision-making details documented in ED Course.  URI symptoms for the past 8 days treated with Zithromax and prednisone now with new pleuritic chest pain worse with  deep breathing and coughing.  No hypoxia.  Chest x-ray obtained in triage is negative for infiltrate or pneumothorax.  Results reviewed interpreted by me.  D-dimer is obtained to rule out pulmonary embolism.  This is negative.  Labs otherwise reassuring.  Low suspicion for ACS.  Patient has been sick for over 1 week.  She is out of Tamiflu window.  Risk and benefits discussed with patient and mother.  They declined.  Recommend supportive care at home with anti-inflammatories, antipyretics and oral hydration and PCP follow-up.  Return to the ED with exertional chest pain, pain associate with shortness of breath, nausea, vomiting, and any other concerns.        Final Clinical Impression(s) / ED Diagnoses Final diagnoses:  Influenza  Pleurisy    Rx / DC Orders ED Discharge Orders     None         Randen Kauth, Jeannett Senior, MD 07/06/22 2114

## 2022-07-06 NOTE — ED Triage Notes (Signed)
Pt states had virtual visit Saturday and was dx with bronchitis and pneumonia, started taking zpack and prednisone  State today left lung pain( sharp) with cough and deep breath

## 2022-07-15 ENCOUNTER — Encounter: Payer: Self-pay | Admitting: Nurse Practitioner

## 2022-07-15 ENCOUNTER — Ambulatory Visit: Payer: BC Managed Care – PPO | Admitting: Nurse Practitioner

## 2022-07-15 VITALS — BP 124/80 | HR 79 | Temp 98.4°F | Ht 63.75 in | Wt 191.2 lb

## 2022-07-15 DIAGNOSIS — R631 Polydipsia: Secondary | ICD-10-CM | POA: Diagnosis not present

## 2022-07-15 DIAGNOSIS — R002 Palpitations: Secondary | ICD-10-CM | POA: Diagnosis not present

## 2022-07-15 DIAGNOSIS — Z23 Encounter for immunization: Secondary | ICD-10-CM | POA: Diagnosis not present

## 2022-07-15 DIAGNOSIS — F32A Depression, unspecified: Secondary | ICD-10-CM | POA: Diagnosis not present

## 2022-07-15 DIAGNOSIS — F419 Anxiety disorder, unspecified: Secondary | ICD-10-CM | POA: Diagnosis not present

## 2022-07-15 DIAGNOSIS — E669 Obesity, unspecified: Secondary | ICD-10-CM | POA: Diagnosis not present

## 2022-07-15 MED ORDER — SERTRALINE HCL 25 MG PO TABS: 25.0000 mg | ORAL_TABLET | Freq: Every day | ORAL | 3 refills | Status: DC

## 2022-07-15 NOTE — Assessment & Plan Note (Signed)
Chronic, not controlled.  She has been having depression and anxiety since she was a teenager, however has not been on any treatment recently.  Her PHQ-9 is a 17 and her GAD-7 is a 19.  She denies SI/HI.  Will have her start Zoloft 25 mg daily.  Discussed possible side effects.  Follow-up in 4 to 6 weeks.

## 2022-07-15 NOTE — Progress Notes (Signed)
New Patient Visit  BP 124/80 (BP Location: Left Arm, Patient Position: Sitting, Cuff Size: Large)   Pulse 79   Temp 98.4 F (36.9 C) (Temporal)   Ht 5' 3.75" (1.619 m)   Wt 191 lb 3.2 oz (86.7 kg)   LMP 07/06/2022   SpO2 98%   BMI 33.08 kg/m    Subjective:    Patient ID: [name] [name], female    DOB: 1991/05/07, 31 y.o.   MRN: [id number]  CC: Chief Complaint  Patient presents with   Establish Care    Diabetic concerns. Patient non fasting    HPI: [name] [name] is a 31 y.o. female presents for new patient visit to establish care.  Introduced to Publishing rights manager role and practice setting.  All questions answered.  Discussed provider/patient relationship and expectations.  Kenzlie has a history of anxiety and states that some days are worse than others.  She states that recently she has been having heart palpitations and sometimes pain in her left arm at night.  She started keeping track of when this would happen and noticed the palpitations occurred on days with higher anxiety.  She states that she had done inpatient behavioral health when she was 31 years old for 2 weeks and was discharged on Wellbutrin.  She does not remember if this medication helped or if she had side effects.  She states that she tends to feel down on herself at times and she does have crying spells.  She is interested in medication to help with her anxiety.  She recently went to the emergency room on 07/06/2022 for bronchitis and was diagnosed with influenza.  She states that she is feeling better now.  She was vaping, however with recent influenza being really bad, she has not started vaping since being sick.  Her plan is not to restart this either.  She has been having excessive thirst for the past month and is concerned about diabetes with her family history.  She states that she has also been having some urinary urgency, however not frequency.  She denies chest pain and shortness of breath.      07/15/2022    4:32 PM  Depression screen PHQ 2/9  Decreased Interest 1  Down, Depressed, Hopeless 2  PHQ - 2 Score 3  Altered sleeping 3  Tired, decreased energy 2  Change in appetite 3  Feeling bad or failure about yourself  2  Trouble concentrating 2  Moving slowly or fidgety/restless 1  Suicidal thoughts 1  PHQ-9 Score 17  Difficult doing work/chores Somewhat difficult      07/15/2022    4:32 PM  GAD 7 : Generalized Anxiety Score  Nervous, Anxious, on Edge 3  Control/stop worrying 3  Worry too much - different things 3  Trouble relaxing 2  Restless 2  Easily annoyed or irritable 3  Afraid - awful might happen 3  Total GAD 7 Score 19  Anxiety Difficulty Very difficult    Past Medical History:  Diagnosis Date   Abnormal Pap smear of cervix    Anxiety    Breast feeding status of mother    Migraine     Past Surgical History:  Procedure Laterality Date   TONSILLECTOMY      Family History  Problem Relation Age of Onset   Polycystic ovary syndrome Mother    Endometriosis Mother    Sleep apnea Mother    Cancer Maternal Grandmother        uterine  Hypertension Paternal Grandmother    Diabetes Paternal Grandmother    Thyroid disease Paternal Grandmother      Social History   Tobacco Use   Smoking status: Former    Types: E-cigarettes, Cigarettes    Passive exposure: Never   Smokeless tobacco: Never  Vaping Use   Vaping Use: Former  Substance Use Topics   Alcohol use: Yes    Comment: occ   Drug use: Yes    Types: Marijuana    No current outpatient medications on file prior to visit.   No current facility-administered medications on file prior to visit.     Review of Systems  Constitutional:  Positive for fatigue. Negative for fever.  HENT:  Positive for congestion and ear pain. Negative for sore throat.   Eyes: Negative.   Respiratory: Negative.    Cardiovascular:  Positive for palpitations. Negative for chest pain.  Gastrointestinal:   Positive for constipation (intermittent) and diarrhea (typically in the mornings). Negative for abdominal pain.  Endocrine: Positive for polydipsia.  Genitourinary: Negative.   Musculoskeletal: Negative.   Skin: Negative.   Neurological: Negative.   Psychiatric/Behavioral:  The patient is nervous/anxious.        Objective:    BP 124/80 (BP Location: Left Arm, Patient Position: Sitting, Cuff Size: Large)   Pulse 79   Temp 98.4 F (36.9 C) (Temporal)   Ht 5' 3.75" (1.619 m)   Wt 191 lb 3.2 oz (86.7 kg)   LMP 07/06/2022   SpO2 98%   BMI 33.08 kg/m   Wt Readings from Last 3 Encounters:  07/15/22 191 lb 3.2 oz (86.7 kg)  07/06/22 175 lb 0.7 oz (79.4 kg)  10/31/20 175 lb (79.4 kg)    BP Readings from Last 3 Encounters:  07/15/22 124/80  07/06/22 110/84  10/31/20 120/86    Physical Exam Vitals and nursing note reviewed.  Constitutional:      General: She is not in acute distress.    Appearance: Normal appearance.  HENT:     Head: Normocephalic and atraumatic.     Right Ear: Tympanic membrane, ear canal and external ear normal.     Left Ear: Tympanic membrane, ear canal and external ear normal.  Eyes:     Conjunctiva/sclera: Conjunctivae normal.  Cardiovascular:     Rate and Rhythm: Normal rate and regular rhythm.     Pulses: Normal pulses.     Heart sounds: Normal heart sounds.  Pulmonary:     Effort: Pulmonary effort is normal.     Breath sounds: Normal breath sounds.  Abdominal:     Palpations: Abdomen is soft.     Tenderness: There is no abdominal tenderness.  Musculoskeletal:        General: Normal range of motion.     Cervical back: Normal range of motion and neck supple.     Right lower leg: No edema.     Left lower leg: No edema.  Lymphadenopathy:     Cervical: No cervical adenopathy.  Skin:    General: Skin is warm and dry.  Neurological:     General: No focal deficit present.     Mental Status: She is alert and oriented to person, place, and time.      Cranial Nerves: No cranial nerve deficit.     Coordination: Coordination normal.     Gait: Gait normal.  Psychiatric:        Mood and Affect: Mood normal.        Behavior: Behavior normal.  Thought Content: Thought content normal.        Judgment: Judgment normal.       Assessment & Plan:   Problem List Items Addressed This Visit       Other   Anxiety and depression - Primary    Chronic, not controlled.  She has been having depression and anxiety since she was a teenager, however has not been on any treatment recently.  Her PHQ-9 is a 17 and her GAD-7 is a 19.  She denies SI/HI.  Will have her start Zoloft 25 mg daily.  Discussed possible side effects.  Follow-up in 4 to 6 weeks.      Relevant Medications   sertraline (ZOLOFT) 25 MG tablet   Other Relevant Orders   TSH   Palpitations    She has been experiencing palpitations, that worsened when she has a more stressful or anxious day.  She had a troponin and D-dimer in the emergency room which was negative.  Palpitations are most likely related to anxiety.  Will check TSH, CBC today.  Discussed limiting caffeine.  Follow-up in 4 to 6 weeks.      Relevant Orders   CBC   TSH   Obesity (BMI 30-39.9)    BMI is 33.  Discussed nutrition, exercise.      Other Visit Diagnoses     Excessive thirst       Will check A1c today, recent glucose from ER visit was normal.   Relevant Orders   Hemoglobin A1c   Need for influenza vaccination       Flu vaccine given today   Relevant Orders   Flu Vaccine QUAD 6+ mos PF IM (Fluarix Quad PF) (Completed)        Follow up plan: Return in about 4 weeks (around 08/12/2022) for 4-6 weeks , Anxiety, Depression.

## 2022-07-15 NOTE — Assessment & Plan Note (Signed)
BMI is 33.  Discussed nutrition, exercise.

## 2022-07-15 NOTE — Patient Instructions (Signed)
It was great to see you!  Start sertraline 1 tablet daily for your anxiety.   We are checking your labs today and will let you know the results via mychart/phone.   Let's follow-up in 4-6 weeks, sooner if you have concerns.  If a referral was placed today, you will be contacted for an appointment. Please note that routine referrals can sometimes take up to 3-4 weeks to process. Please call our office if you haven't heard anything after this time frame.  Take care,  Rodman Pickle, NP

## 2022-07-15 NOTE — Assessment & Plan Note (Signed)
She has been experiencing palpitations, that worsened when she has a more stressful or anxious day.  She had a troponin and D-dimer in the emergency room which was negative.  Palpitations are most likely related to anxiety.  Will check TSH, CBC today.  Discussed limiting caffeine.  Follow-up in 4 to 6 weeks.

## 2022-07-16 LAB — CBC
HCT: 43.8 % (ref 36.0–46.0)
Hemoglobin: 14.2 g/dL (ref 12.0–15.0)
MCHC: 32.4 g/dL (ref 30.0–36.0)
MCV: 95.7 fl (ref 78.0–100.0)
Platelets: 276 10*3/uL (ref 150.0–400.0)
RBC: 4.58 Mil/uL (ref 3.87–5.11)
RDW: 14.6 % (ref 11.5–15.5)
WBC: 9.4 10*3/uL (ref 4.0–10.5)

## 2022-07-16 LAB — TSH: TSH: 1.79 u[IU]/mL (ref 0.35–5.50)

## 2022-07-16 LAB — HEMOGLOBIN A1C: Hgb A1c MFr Bld: 5.5 % (ref 4.6–6.5)

## 2022-07-21 ENCOUNTER — Ambulatory Visit: Payer: BC Managed Care – PPO | Admitting: Nurse Practitioner

## 2022-08-17 ENCOUNTER — Encounter: Payer: Self-pay | Admitting: Nurse Practitioner

## 2022-08-17 ENCOUNTER — Ambulatory Visit: Payer: BC Managed Care – PPO | Admitting: Nurse Practitioner

## 2022-08-17 VITALS — BP 104/68 | HR 69 | Temp 97.5°F | Ht 63.75 in | Wt 192.8 lb

## 2022-08-17 DIAGNOSIS — E669 Obesity, unspecified: Secondary | ICD-10-CM | POA: Diagnosis not present

## 2022-08-17 DIAGNOSIS — F32A Depression, unspecified: Secondary | ICD-10-CM | POA: Diagnosis not present

## 2022-08-17 DIAGNOSIS — F419 Anxiety disorder, unspecified: Secondary | ICD-10-CM | POA: Diagnosis not present

## 2022-08-17 MED ORDER — SERTRALINE HCL 25 MG PO TABS: 25.0000 mg | ORAL_TABLET | Freq: Every day | ORAL | 1 refills | Status: DC

## 2022-08-17 NOTE — Progress Notes (Signed)
Established Patient Office Visit  Subjective   Patient ID: [name] [name], female    DOB: 1990/08/17  Age: 32 y.o. MRN: [id number]  Chief Complaint  Patient presents with   Follow-up    Follow up for meds for depression ,  no side effect she said work great     HPI  [name] [name] is here to follow-up on anxiety and depression. Last visit she was started on sertraline 25mg  daily.   She states that her anxiety is doing a lot better since starting the sertraline. She states that things that would normally make her angry or short-tempered, have not affected her as much. She is noticing however that she is more tired than she was in the past. She is able to work and help her kids, but around 8pm if she doesn't have any thing else that needs to get done, she will go to bed.   She has also noticed that she may have binge eating disorder. She states that she has been gaining weight. She and her husband are trying to eat healthier. She will eat a breakfast high in protein at home, is full, then on the way to work, she will still stop and eat a fast food meal, even though she is not hungry. She states that she eats out multiple times per day. She has not been honest with her husband or family how much she is actually eating.      08/17/2022    4:37 PM 07/15/2022    4:32 PM  Depression screen PHQ 2/9  Decreased Interest 0 1  Down, Depressed, Hopeless 0 2  PHQ - 2 Score 0 3  Altered sleeping 2 3  Tired, decreased energy 2 2  Change in appetite 3 3  Feeling bad or failure about yourself  0 2  Trouble concentrating 0 2  Moving slowly or fidgety/restless 0 1  Suicidal thoughts 0 1  PHQ-9 Score 7 17  Difficult doing work/chores Somewhat difficult Somewhat difficult      08/17/2022    4:37 PM 07/15/2022    4:32 PM  GAD 7 : Generalized Anxiety Score  Nervous, Anxious, on Edge 0 3  Control/stop worrying 0 3  Worry too much - different things 0 3  Trouble relaxing 0 2  Restless 0 2   Easily annoyed or irritable 0 3  Afraid - awful might happen 0 3  Total GAD 7 Score 0 19  Anxiety Difficulty Not difficult at all Very difficult      ROS See pertinent positives and negatives per HPI.    Objective:     BP 104/68   Pulse 69   Temp (!) 97.5 F (36.4 C)   Ht 5' 3.75" (1.619 m)   Wt 192 lb 12.8 oz (87.5 kg)   SpO2 95%   BMI 33.35 kg/m    Physical Exam Vitals and nursing note reviewed.  Constitutional:      General: She is not in acute distress.    Appearance: Normal appearance.  HENT:     Head: Normocephalic.  Eyes:     Conjunctiva/sclera: Conjunctivae normal.  Cardiovascular:     Rate and Rhythm: Normal rate and regular rhythm.     Pulses: Normal pulses.     Heart sounds: Normal heart sounds.  Pulmonary:     Effort: Pulmonary effort is normal.     Breath sounds: Normal breath sounds.  Musculoskeletal:     Cervical back: Normal range of motion.  Skin:    General: Skin is warm.  Neurological:     General: No focal deficit present.     Mental Status: She is alert and oriented to person, place, and time.  Psychiatric:        Mood and Affect: Mood normal.        Behavior: Behavior normal.        Thought Content: Thought content normal.        Judgment: Judgment normal.      Assessment & Plan:   Problem List Items Addressed This Visit       Other   Anxiety and depression - Primary    Chronic, improving. She states the sertraline is helping with her anxiety, however she is sleeping more than she normally would. Her PHQ 9 has gone from a 17 to a 7 and her GAD 7 has gone from a 19 to a 0. Will have her continue the sertraline 25mg  daily, refill sent to the pharmacy. She is also interested in talking with a therapist, will place referral. Follow-up in 3 months.       Relevant Medications   sertraline (ZOLOFT) 25 MG tablet   Other Relevant Orders   Ambulatory referral to Psychology   Obesity (BMI 30-39.9)    BMI 33.3. She does have some  binge eating tendencies with eating even though she is not hungry. Discussed ways to try to help with binge eating, including discussing with her husband and trying to figure out the emotional reason for her eating even though she is full. She is interested in talking with a therapist. Referral placed.       Relevant Orders   Ambulatory referral to Psychology    Return in about 3 months (around 11/16/2022) for CPE.    Charyl Dancer, NP

## 2022-08-17 NOTE — Assessment & Plan Note (Addendum)
Chronic, improving. She states the sertraline is helping with her anxiety, however she is sleeping more than she normally would. Her PHQ 9 has gone from a 17 to a 7 and her GAD 7 has gone from a 19 to a 0. Will have her continue the sertraline 25mg  daily, refill sent to the pharmacy. She is also interested in talking with a therapist, will place referral. Follow-up in 3 months.

## 2022-08-17 NOTE — Patient Instructions (Signed)
It was great to see you!  Continue the zoloft daily.   Let's follow-up in 3 months, sooner if you have concerns.  If a referral was placed today, you will be contacted for an appointment. Please note that routine referrals can sometimes take up to 3-4 weeks to process. Please call our office if you haven't heard anything after this time frame.  Take care,  Vance Peper, NP

## 2022-08-17 NOTE — Assessment & Plan Note (Signed)
BMI 33.3. She does have some binge eating tendencies with eating even though she is not hungry. Discussed ways to try to help with binge eating, including discussing with her husband and trying to figure out the emotional reason for her eating even though she is full. She is interested in talking with a therapist. Referral placed.

## 2022-11-14 ENCOUNTER — Other Ambulatory Visit: Payer: Self-pay | Admitting: Nurse Practitioner

## 2022-11-15 NOTE — Telephone Encounter (Signed)
Lft VM to rtn call to schedule appointment. Dm/cma

## 2023-04-18 ENCOUNTER — Telehealth (INDEPENDENT_AMBULATORY_CARE_PROVIDER_SITE_OTHER): Payer: BC Managed Care – PPO | Admitting: Nurse Practitioner

## 2023-04-18 ENCOUNTER — Encounter: Payer: Self-pay | Admitting: Nurse Practitioner

## 2023-04-18 VITALS — Wt 197.0 lb

## 2023-04-18 DIAGNOSIS — F32A Depression, unspecified: Secondary | ICD-10-CM

## 2023-04-18 DIAGNOSIS — F419 Anxiety disorder, unspecified: Secondary | ICD-10-CM

## 2023-04-18 DIAGNOSIS — Z6834 Body mass index (BMI) 34.0-34.9, adult: Secondary | ICD-10-CM | POA: Diagnosis not present

## 2023-04-18 DIAGNOSIS — E669 Obesity, unspecified: Secondary | ICD-10-CM

## 2023-04-18 MED ORDER — CONTRAVE 8-90 MG PO TB12: ORAL_TABLET | ORAL | 0 refills | Status: DC

## 2023-04-18 NOTE — Assessment & Plan Note (Addendum)
BMI 34.  She notes that she has been trying to exercise, be more cognizant of what she is eating and eat out less frequently.  However even with doing this, her weight has gone up 10 pounds over the last few months.  She is interested in medication to help with weight loss.  Will have her start Contrave titration.  Did discuss possible side effects.  Continue focus on nutrition and exercise. Follow-up in 4 to 6 weeks.

## 2023-04-18 NOTE — Patient Instructions (Signed)
It was great to see you!  Start contrave 1 tablet daily for 7 days, then increase to 1 tablet twice a day for 7 days, then 2 tablets in the morning and 1 in the afternoon for 7 days, then 2 tablets twice a day.   If you find a lower dose working, you do not need to increase the dose.  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  Set up an account with [facility] to get a discounted price:  [email or web address]  Let's follow-up in 4-6 weeks, sooner if you have concerns.  If a referral was placed today, you will be contacted for an appointment. Please note that routine referrals can sometimes take up to 3-4 weeks to process. Please call our office if you haven't heard anything after this time frame.  Take care,  Rodman Pickle, NP

## 2023-04-18 NOTE — Progress Notes (Signed)
[facility] [address] [place] [address] Dept: [phone] Dept Fax: [phone]  Virtual Video Visit  I connected with [name] [name] on 04/18/23 at 10:40 AM EDT by a video enabled telemedicine application and verified that I am speaking with the correct person using two identifiers.  Location patient: Home Location provider: Clinic Persons participating in the virtual visit: Patient; Rodman Pickle, NP; Malena Peer, CMA  I discussed the limitations of evaluation and management by telemedicine and the availability of in person appointments. The patient expressed understanding and agreed to proceed.  Chief Complaint  Patient presents with   Obesity    She has been making changes with lifestyle, however is not losing weight    SUBJECTIVE:  HPI: [name] [name] is a 32 y.o. female who presents to discuss weight management.   She has started working night shift 4 days a week.   She notes that she started putting on weight in 2021. She does not feel like she changed drastically what she was eating. She has been overeating and binging, however it has been better since she started being more conscious of her food choices. She has been doing more exercise and walking on her lunch breaks. She states that her most recent weight was 197. She noted that her weight was 187 before making these changes and is getting discouraged because her weight has gone up instead of down. She is starting to feel some pain in her hips and joints from the excess weight.   She states that her anxiety is doing better.  She has developed some open mechanisms and has stopped the sertraline.  She states that the sertraline did help, however she is doing well without the medication at this point as well.  Patient Active Problem List   Diagnosis Date Noted   Anxiety and depression 07/15/2022   Palpitations 07/15/2022   Obesity (BMI 30-39.9)  07/15/2022    Past Surgical History:  Procedure Laterality Date   TONSILLECTOMY      Family History  Problem Relation Age of Onset   Polycystic ovary syndrome Mother    Endometriosis Mother    Sleep apnea Mother    Cancer Maternal Grandmother        uterine   Hypertension Paternal Grandmother    Diabetes Paternal Grandmother    Thyroid disease Paternal Grandmother     Social History   Tobacco Use   Smoking status: Former    Types: E-cigarettes, Cigarettes    Passive exposure: Never   Smokeless tobacco: Never  Vaping Use   Vaping status: Former  Substance Use Topics   Alcohol use: Yes    Comment: occ   Drug use: Yes    Types: Marijuana     Current Outpatient Medications:    Naltrexone-buPROPion HCl ER (CONTRAVE) 8-90 MG TB12, Start 1 tablet every morning for 7 days, then 1 tablet twice daily for 7 days, then 2 tablets every morning and one every evening for 7 days, then 2 tablets in the morning and 2 tablets in the evening., Disp: 120 tablet, Rfl: 0  No Known Allergies  ROS: See pertinent positives and negatives per HPI.  OBSERVATIONS/OBJECTIVE:  VITALS per patient if applicable: Today's Vitals   04/18/23 1050  Weight: 197 lb (89.4 kg)   Body mass index is 34.08 kg/m.    GENERAL: Alert and oriented. Appears well and in no acute distress.  HEENT: Atraumatic. Conjunctiva clear. No obvious abnormalities on  inspection of external nose and ears.  NECK: Normal movements of the head and neck.  LUNGS: On inspection, no signs of respiratory distress. Breathing rate appears normal. No obvious gross SOB, gasping or wheezing, and no conversational dyspnea.  CV: No obvious cyanosis.  MS: Moves all visible extremities without noticeable abnormality.  PSYCH/NEURO: Pleasant and cooperative. No obvious depression or anxiety. Speech and thought processing grossly intact.  ASSESSMENT AND PLAN:  Problem List Items Addressed This Visit       Other   Anxiety and  depression - Primary    Chronic, stable.  She states that she has developed some coping mechanisms while taking the sertraline which have helped with her anxiety.  She has stopped the sertraline and noticed that she is still doing well.  Follow-up if symptoms worsen or with any concerns.      Obesity (BMI 30-39.9)    BMI 34.  She notes that she has been trying to exercise, be more cognizant of what she is eating and eat out less frequently.  However even with doing this, her weight has gone up 10 pounds over the last few months.  She is interested in medication to help with weight loss.  Will have her start Contrave titration.  Did discuss possible side effects.  Continue focus on nutrition and exercise. Follow-up in 4 to 6 weeks.      Relevant Medications   Naltrexone-buPROPion HCl ER (CONTRAVE) 8-90 MG TB12     I discussed the assessment and treatment plan with the patient. The patient was provided an opportunity to ask questions and all were answered. The patient agreed with the plan and demonstrated an understanding of the instructions.   The patient was advised to call back or seek an in-person evaluation if the symptoms worsen or if the condition fails to improve as anticipated.   Gerre Scull, NP

## 2023-04-18 NOTE — Assessment & Plan Note (Signed)
Chronic, stable.  She states that she has developed some coping mechanisms while taking the sertraline which have helped with her anxiety.  She has stopped the sertraline and noticed that she is still doing well.  Follow-up if symptoms worsen or with any concerns.

## 2023-04-25 ENCOUNTER — Telehealth: Payer: BC Managed Care – PPO | Admitting: Nurse Practitioner

## 2023-09-09 DIAGNOSIS — N911 Secondary amenorrhea: Secondary | ICD-10-CM | POA: Diagnosis not present

## 2023-09-28 DIAGNOSIS — Z3481 Encounter for supervision of other normal pregnancy, first trimester: Secondary | ICD-10-CM | POA: Diagnosis not present

## 2023-09-28 DIAGNOSIS — Z3A10 10 weeks gestation of pregnancy: Secondary | ICD-10-CM | POA: Diagnosis not present

## 2023-09-28 DIAGNOSIS — Z3685 Encounter for antenatal screening for Streptococcus B: Secondary | ICD-10-CM | POA: Diagnosis not present

## 2023-09-28 DIAGNOSIS — Z3400 Encounter for supervision of normal first pregnancy, unspecified trimester: Secondary | ICD-10-CM | POA: Diagnosis not present

## 2023-09-28 LAB — OB RESULTS CONSOLE HEPATITIS B SURFACE ANTIGEN: Hepatitis B Surface Ag: NEGATIVE

## 2023-09-28 LAB — OB RESULTS CONSOLE RUBELLA ANTIBODY, IGM: Rubella: IMMUNE

## 2023-09-30 DIAGNOSIS — R87612 Low grade squamous intraepithelial lesion on cytologic smear of cervix (LGSIL): Secondary | ICD-10-CM | POA: Diagnosis not present

## 2023-09-30 DIAGNOSIS — Z3A10 10 weeks gestation of pregnancy: Secondary | ICD-10-CM | POA: Diagnosis not present

## 2023-09-30 DIAGNOSIS — Z113 Encounter for screening for infections with a predominantly sexual mode of transmission: Secondary | ICD-10-CM | POA: Diagnosis not present

## 2023-09-30 DIAGNOSIS — R87810 Cervical high risk human papillomavirus (HPV) DNA test positive: Secondary | ICD-10-CM | POA: Diagnosis not present

## 2023-09-30 DIAGNOSIS — Z3400 Encounter for supervision of normal first pregnancy, unspecified trimester: Secondary | ICD-10-CM | POA: Diagnosis not present

## 2023-10-07 LAB — HM PAP SMEAR

## 2023-11-20 ENCOUNTER — Inpatient Hospital Stay (HOSPITAL_COMMUNITY)
Admission: EM | Admit: 2023-11-20 | Discharge: 2023-11-23 | DRG: 819 | Disposition: A | Discharge status: Home Health | Attending: Internal Medicine | Admitting: Internal Medicine

## 2023-11-20 ENCOUNTER — Emergency Department (HOSPITAL_COMMUNITY)

## 2023-11-20 ENCOUNTER — Encounter (HOSPITAL_COMMUNITY): Payer: Self-pay

## 2023-11-20 ENCOUNTER — Other Ambulatory Visit: Payer: Self-pay

## 2023-11-20 DIAGNOSIS — S82209A Unspecified fracture of shaft of unspecified tibia, initial encounter for closed fracture: Secondary | ICD-10-CM | POA: Diagnosis not present

## 2023-11-20 DIAGNOSIS — D72829 Elevated white blood cell count, unspecified: Secondary | ICD-10-CM | POA: Insufficient documentation

## 2023-11-20 DIAGNOSIS — S82201A Unspecified fracture of shaft of right tibia, initial encounter for closed fracture: Principal | ICD-10-CM | POA: Diagnosis present

## 2023-11-20 DIAGNOSIS — O99012 Anemia complicating pregnancy, second trimester: Secondary | ICD-10-CM | POA: Diagnosis not present

## 2023-11-20 DIAGNOSIS — S82391A Other fracture of lower end of right tibia, initial encounter for closed fracture: Secondary | ICD-10-CM | POA: Diagnosis not present

## 2023-11-20 DIAGNOSIS — Z87891 Personal history of nicotine dependence: Secondary | ICD-10-CM | POA: Diagnosis not present

## 2023-11-20 DIAGNOSIS — Y93K1 Activity, walking an animal: Secondary | ICD-10-CM | POA: Diagnosis not present

## 2023-11-20 DIAGNOSIS — T402X5A Adverse effect of other opioids, initial encounter: Secondary | ICD-10-CM | POA: Diagnosis not present

## 2023-11-20 DIAGNOSIS — I10 Essential (primary) hypertension: Secondary | ICD-10-CM | POA: Diagnosis not present

## 2023-11-20 DIAGNOSIS — S82251A Displaced comminuted fracture of shaft of right tibia, initial encounter for closed fracture: Secondary | ICD-10-CM | POA: Diagnosis not present

## 2023-11-20 DIAGNOSIS — Z833 Family history of diabetes mellitus: Secondary | ICD-10-CM | POA: Diagnosis not present

## 2023-11-20 DIAGNOSIS — Z79899 Other long term (current) drug therapy: Secondary | ICD-10-CM | POA: Diagnosis not present

## 2023-11-20 DIAGNOSIS — W010XXA Fall on same level from slipping, tripping and stumbling without subsequent striking against object, initial encounter: Secondary | ICD-10-CM | POA: Diagnosis present

## 2023-11-20 DIAGNOSIS — Y92239 Unspecified place in hospital as the place of occurrence of the external cause: Secondary | ICD-10-CM | POA: Diagnosis not present

## 2023-11-20 DIAGNOSIS — O99112 Other diseases of the blood and blood-forming organs and certain disorders involving the immune mechanism complicating pregnancy, second trimester: Secondary | ICD-10-CM | POA: Diagnosis present

## 2023-11-20 DIAGNOSIS — S82891A Other fracture of right lower leg, initial encounter for closed fracture: Secondary | ICD-10-CM | POA: Diagnosis not present

## 2023-11-20 DIAGNOSIS — S82452A Displaced comminuted fracture of shaft of left fibula, initial encounter for closed fracture: Secondary | ICD-10-CM | POA: Diagnosis not present

## 2023-11-20 DIAGNOSIS — S82401A Unspecified fracture of shaft of right fibula, initial encounter for closed fracture: Secondary | ICD-10-CM | POA: Diagnosis not present

## 2023-11-20 DIAGNOSIS — R110 Nausea: Secondary | ICD-10-CM | POA: Diagnosis not present

## 2023-11-20 DIAGNOSIS — D649 Anemia, unspecified: Secondary | ICD-10-CM | POA: Diagnosis not present

## 2023-11-20 DIAGNOSIS — Z8249 Family history of ischemic heart disease and other diseases of the circulatory system: Secondary | ICD-10-CM | POA: Diagnosis not present

## 2023-11-20 DIAGNOSIS — S82831A Other fracture of upper and lower end of right fibula, initial encounter for closed fracture: Secondary | ICD-10-CM | POA: Diagnosis present

## 2023-11-20 DIAGNOSIS — S80911A Unspecified superficial injury of right knee, initial encounter: Secondary | ICD-10-CM | POA: Diagnosis not present

## 2023-11-20 DIAGNOSIS — R0689 Other abnormalities of breathing: Secondary | ICD-10-CM | POA: Diagnosis not present

## 2023-11-20 DIAGNOSIS — O9A212 Injury, poisoning and certain other consequences of external causes complicating pregnancy, second trimester: Principal | ICD-10-CM | POA: Diagnosis present

## 2023-11-20 DIAGNOSIS — W19XXXA Unspecified fall, initial encounter: Secondary | ICD-10-CM | POA: Diagnosis not present

## 2023-11-20 DIAGNOSIS — S82301A Unspecified fracture of lower end of right tibia, initial encounter for closed fracture: Secondary | ICD-10-CM | POA: Diagnosis not present

## 2023-11-20 DIAGNOSIS — M25571 Pain in right ankle and joints of right foot: Secondary | ICD-10-CM | POA: Diagnosis not present

## 2023-11-20 DIAGNOSIS — S82871A Displaced pilon fracture of right tibia, initial encounter for closed fracture: Secondary | ICD-10-CM | POA: Diagnosis not present

## 2023-11-20 DIAGNOSIS — S82451A Displaced comminuted fracture of shaft of right fibula, initial encounter for closed fracture: Secondary | ICD-10-CM | POA: Diagnosis not present

## 2023-11-20 DIAGNOSIS — S82301D Unspecified fracture of lower end of right tibia, subsequent encounter for closed fracture with routine healing: Secondary | ICD-10-CM | POA: Diagnosis not present

## 2023-11-20 DIAGNOSIS — M79604 Pain in right leg: Secondary | ICD-10-CM | POA: Diagnosis not present

## 2023-11-20 DIAGNOSIS — Z3A17 17 weeks gestation of pregnancy: Secondary | ICD-10-CM

## 2023-11-20 DIAGNOSIS — S99911A Unspecified injury of right ankle, initial encounter: Secondary | ICD-10-CM | POA: Diagnosis not present

## 2023-11-20 DIAGNOSIS — D508 Other iron deficiency anemias: Secondary | ICD-10-CM | POA: Diagnosis not present

## 2023-11-20 LAB — CBC WITH DIFFERENTIAL/PLATELET
Abs Immature Granulocytes: 0.06 10*3/uL (ref 0.00–0.07)
Basophils Absolute: 0.1 10*3/uL (ref 0.0–0.1)
Basophils Relative: 0 %
Eosinophils Absolute: 0.2 10*3/uL (ref 0.0–0.5)
Eosinophils Relative: 1 %
HCT: 35.4 % — ABNORMAL LOW (ref 36.0–46.0)
Hemoglobin: 11.5 g/dL — ABNORMAL LOW (ref 12.0–15.0)
Immature Granulocytes: 0 %
Lymphocytes Relative: 19 %
Lymphs Abs: 3.1 10*3/uL (ref 0.7–4.0)
MCH: 31 pg (ref 26.0–34.0)
MCHC: 32.5 g/dL (ref 30.0–36.0)
MCV: 95.4 fL (ref 80.0–100.0)
Monocytes Absolute: 0.8 10*3/uL (ref 0.1–1.0)
Monocytes Relative: 5 %
Neutro Abs: 11.9 10*3/uL — ABNORMAL HIGH (ref 1.7–7.7)
Neutrophils Relative %: 75 %
Platelets: 310 10*3/uL (ref 150–400)
RBC: 3.71 MIL/uL — ABNORMAL LOW (ref 3.87–5.11)
RDW: 12.8 % (ref 11.5–15.5)
WBC: 16.1 10*3/uL — ABNORMAL HIGH (ref 4.0–10.5)
nRBC: 0 % (ref 0.0–0.2)

## 2023-11-20 LAB — BASIC METABOLIC PANEL WITH GFR
Anion gap: 12 (ref 5–15)
BUN: 12 mg/dL (ref 6–20)
CO2: 19 mmol/L — ABNORMAL LOW (ref 22–32)
Calcium: 9.1 mg/dL (ref 8.9–10.3)
Chloride: 107 mmol/L (ref 98–111)
Creatinine, Ser: 0.63 mg/dL (ref 0.44–1.00)
GFR, Estimated: >60 mL/min (ref >60)
Glucose, Bld: 98 mg/dL (ref 70–99)
Potassium: 3.7 mmol/L (ref 3.5–5.1)
Sodium: 138 mmol/L (ref 135–145)

## 2023-11-20 MED ORDER — SODIUM CHLORIDE 0.9 % IV SOLN
INTRAVENOUS | Status: AC | PRN
  Administered 2023-11-20: 1000 mL via INTRAVENOUS

## 2023-11-20 MED ORDER — FENTANYL CITRATE PF 50 MCG/ML IJ SOSY
100.0000 ug | PREFILLED_SYRINGE | Freq: Once | INTRAMUSCULAR | Status: AC
  Administered 2023-11-20: 100 ug via INTRAVENOUS
  Filled 2023-11-20: qty 2

## 2023-11-20 MED ORDER — PROPOFOL 10 MG/ML IV BOLUS
INTRAVENOUS | Status: DC | PRN
  Administered 2023-11-20 (×3): 45 mg via INTRAVENOUS
  Administered 2023-11-20: 20 mg via INTRAVENOUS

## 2023-11-20 MED ORDER — PROPOFOL 10 MG/ML IV BOLUS: 0.5000 mg/kg | Freq: Once | INTRAVENOUS | Status: DC

## 2023-11-20 MED ORDER — LIDOCAINE HCL (PF) 1 % IJ SOLN
30.0000 mL | Freq: Once | INTRAMUSCULAR | Status: DC
  Filled 2023-11-20: qty 30

## 2023-11-20 NOTE — ED Notes (Signed)
Ortho tech paged by this nurse

## 2023-11-20 NOTE — ED Triage Notes (Signed)
 Patient was walking dog on a a wet, grassy hill when right leg folded under her. Patient stated she her a crack. Presenting with obvious deformity to right leg above ankle. No LOC or dizziness. Patient [redacted] weeks pregnant no complications or abdominal pain. Received 300 mcg of fentanyl   EMS VS 132/70 BP 80s hr 98% RA 35 end tidal 26RR

## 2023-11-20 NOTE — Progress Notes (Signed)
 RT stood at bedside during conscious sedation. VS stable throughout.

## 2023-11-21 ENCOUNTER — Encounter (HOSPITAL_COMMUNITY): Payer: Self-pay | Admitting: Internal Medicine

## 2023-11-21 ENCOUNTER — Observation Stay (HOSPITAL_COMMUNITY)

## 2023-11-21 ENCOUNTER — Observation Stay (HOSPITAL_COMMUNITY): Admitting: Certified Registered Nurse Anesthetist

## 2023-11-21 ENCOUNTER — Other Ambulatory Visit: Payer: Self-pay

## 2023-11-21 ENCOUNTER — Encounter (HOSPITAL_COMMUNITY)
Admission: EM | Disposition: A | Discharge status: Home Health | Payer: Self-pay | Source: Home / Self Care | Attending: Internal Medicine

## 2023-11-21 DIAGNOSIS — S82209A Unspecified fracture of shaft of unspecified tibia, initial encounter for closed fracture: Secondary | ICD-10-CM | POA: Diagnosis present

## 2023-11-21 DIAGNOSIS — Z3A17 17 weeks gestation of pregnancy: Secondary | ICD-10-CM

## 2023-11-21 DIAGNOSIS — S82401A Unspecified fracture of shaft of right fibula, initial encounter for closed fracture: Secondary | ICD-10-CM | POA: Diagnosis not present

## 2023-11-21 DIAGNOSIS — S82301D Unspecified fracture of lower end of right tibia, subsequent encounter for closed fracture with routine healing: Secondary | ICD-10-CM | POA: Diagnosis not present

## 2023-11-21 DIAGNOSIS — D649 Anemia, unspecified: Secondary | ICD-10-CM | POA: Diagnosis not present

## 2023-11-21 DIAGNOSIS — S82201A Unspecified fracture of shaft of right tibia, initial encounter for closed fracture: Secondary | ICD-10-CM

## 2023-11-21 DIAGNOSIS — D72829 Elevated white blood cell count, unspecified: Secondary | ICD-10-CM | POA: Insufficient documentation

## 2023-11-21 DIAGNOSIS — S82451A Displaced comminuted fracture of shaft of right fibula, initial encounter for closed fracture: Secondary | ICD-10-CM | POA: Diagnosis not present

## 2023-11-21 DIAGNOSIS — S82251A Displaced comminuted fracture of shaft of right tibia, initial encounter for closed fracture: Secondary | ICD-10-CM | POA: Diagnosis not present

## 2023-11-21 HISTORY — PX: TIBIA IM NAIL INSERTION: SHX2516

## 2023-11-21 LAB — CBC
HCT: 35.5 % — ABNORMAL LOW (ref 36.0–46.0)
HCT: 34.9 % — ABNORMAL LOW (ref 36.0–46.0)
Hemoglobin: 11.6 g/dL — ABNORMAL LOW (ref 12.0–15.0)
Hemoglobin: 11.5 g/dL — ABNORMAL LOW (ref 12.0–15.0)
MCH: 30.6 pg (ref 26.0–34.0)
MCH: 30.5 pg (ref 26.0–34.0)
MCHC: 32.7 g/dL (ref 30.0–36.0)
MCHC: 33 g/dL (ref 30.0–36.0)
MCV: 93.7 fL (ref 80.0–100.0)
MCV: 92.6 fL (ref 80.0–100.0)
Platelets: 275 10*3/uL (ref 150–400)
Platelets: 324 10*3/uL (ref 150–400)
RBC: 3.79 MIL/uL — ABNORMAL LOW (ref 3.87–5.11)
RBC: 3.77 MIL/uL — ABNORMAL LOW (ref 3.87–5.11)
RDW: 12.9 % (ref 11.5–15.5)
RDW: 12.9 % (ref 11.5–15.5)
WBC: 16.5 10*3/uL — ABNORMAL HIGH (ref 4.0–10.5)
WBC: 16.9 10*3/uL — ABNORMAL HIGH (ref 4.0–10.5)
nRBC: 0 % (ref 0.0–0.2)
nRBC: 0 % (ref 0.0–0.2)

## 2023-11-21 LAB — BASIC METABOLIC PANEL WITH GFR
Anion gap: 9 (ref 5–15)
BUN: 8 mg/dL (ref 6–20)
CO2: 17 mmol/L — ABNORMAL LOW (ref 22–32)
Calcium: 8.2 mg/dL — ABNORMAL LOW (ref 8.9–10.3)
Chloride: 112 mmol/L — ABNORMAL HIGH (ref 98–111)
Creatinine, Ser: 0.52 mg/dL (ref 0.44–1.00)
GFR, Estimated: >60 mL/min (ref >60)
Glucose, Bld: 87 mg/dL (ref 70–99)
Potassium: 3.7 mmol/L (ref 3.5–5.1)
Sodium: 138 mmol/L (ref 135–145)

## 2023-11-21 LAB — VITAMIN D 25 HYDROXY (VIT D DEFICIENCY, FRACTURES): Vit D, 25-Hydroxy: 27.35 ng/mL — ABNORMAL LOW (ref 30–100)

## 2023-11-21 LAB — SURGICAL PCR SCREEN
MRSA, PCR: NEGATIVE
Staphylococcus aureus: NEGATIVE

## 2023-11-21 LAB — HCG, QUANTITATIVE, PREGNANCY: hCG, Beta Chain, Quant, S: 18316 m[IU]/mL — ABNORMAL HIGH (ref <5)

## 2023-11-21 LAB — HIV ANTIBODY (ROUTINE TESTING W REFLEX): HIV Screen 4th Generation wRfx: NONREACTIVE

## 2023-11-21 SURGERY — INSERTION, INTRAMEDULLARY ROD, TIBIA
Anesthesia: General | Laterality: Right

## 2023-11-21 MED ORDER — FENTANYL CITRATE PF 50 MCG/ML IJ SOSY
50.0000 ug | PREFILLED_SYRINGE | INTRAMUSCULAR | Status: DC | PRN
  Administered 2023-11-21 (×2): 50 ug via INTRAVENOUS
  Filled 2023-11-21 (×2): qty 1

## 2023-11-21 MED ORDER — LIDOCAINE 2% (20 MG/ML) 5 ML SYRINGE
INTRAMUSCULAR | Status: AC
  Filled 2023-11-21: qty 5

## 2023-11-21 MED ORDER — PROPOFOL 10 MG/ML IV BOLUS
INTRAVENOUS | Status: AC
  Filled 2023-11-21: qty 20

## 2023-11-21 MED ORDER — ONDANSETRON HCL 4 MG PO TABS: 4.0000 mg | ORAL_TABLET | Freq: Four times a day (QID) | ORAL | Status: DC | PRN

## 2023-11-21 MED ORDER — FENTANYL CITRATE (PF) 250 MCG/5ML IJ SOLN
INTRAMUSCULAR | Status: AC
  Filled 2023-11-21: qty 5

## 2023-11-21 MED ORDER — ACETAMINOPHEN 500 MG PO TABS
1000.0000 mg | ORAL_TABLET | Freq: Four times a day (QID) | ORAL | Status: DC
  Administered 2023-11-21 – 2023-11-23 (×9): 1000 mg via ORAL
  Filled 2023-11-21 (×9): qty 2

## 2023-11-21 MED ORDER — CEFAZOLIN SODIUM-DEXTROSE 2-4 GM/100ML-% IV SOLN
2.0000 g | Freq: Once | INTRAVENOUS | Status: AC
  Administered 2023-11-21: 2 g via INTRAVENOUS

## 2023-11-21 MED ORDER — LACTATED RINGERS IV SOLN: INTRAVENOUS | Status: DC

## 2023-11-21 MED ORDER — VANCOMYCIN HCL 1000 MG IV SOLR
INTRAVENOUS | Status: AC
  Filled 2023-11-21: qty 20

## 2023-11-21 MED ORDER — DOCUSATE SODIUM 100 MG PO CAPS
100.0000 mg | ORAL_CAPSULE | Freq: Two times a day (BID) | ORAL | Status: DC
  Administered 2023-11-21 – 2023-11-23 (×5): 100 mg via ORAL
  Filled 2023-11-21 (×5): qty 1

## 2023-11-21 MED ORDER — FENTANYL CITRATE (PF) 100 MCG/2ML IJ SOLN
INTRAMUSCULAR | Status: AC
  Filled 2023-11-21: qty 2

## 2023-11-21 MED ORDER — BUPIVACAINE HCL (PF) 0.25 % IJ SOLN
INTRAMUSCULAR | Status: DC | PRN
Start: 2023-11-21 — End: 2023-11-21
  Administered 2023-11-21: 20 mL

## 2023-11-21 MED ORDER — ACETAMINOPHEN 500 MG PO TABS
1000.0000 mg | ORAL_TABLET | Freq: Once | ORAL | Status: AC
  Administered 2023-11-21: 1000 mg via ORAL
  Filled 2023-11-21: qty 2

## 2023-11-21 MED ORDER — ONDANSETRON HCL 4 MG/2ML IJ SOLN
4.0000 mg | Freq: Once | INTRAMUSCULAR | Status: AC
  Administered 2023-11-21: 4 mg via INTRAVENOUS
  Filled 2023-11-21: qty 2

## 2023-11-21 MED ORDER — CHLORHEXIDINE GLUCONATE 0.12 % MT SOLN: 15.0000 mL | Freq: Once | OROMUCOSAL | Status: AC

## 2023-11-21 MED ORDER — MORPHINE SULFATE (PF) 2 MG/ML IV SOLN: 2.0000 mg | INTRAVENOUS | Status: DC | PRN

## 2023-11-21 MED ORDER — ASPIRIN 81 MG PO TBEC
162.0000 mg | DELAYED_RELEASE_TABLET | Freq: Every day | ORAL | Status: DC
  Administered 2023-11-22 – 2023-11-23 (×2): 162 mg via ORAL
  Filled 2023-11-21 (×2): qty 2

## 2023-11-21 MED ORDER — MORPHINE SULFATE (PF) 2 MG/ML IV SOLN: 1.0000 mg | INTRAVENOUS | Status: DC | PRN

## 2023-11-21 MED ORDER — SODIUM CHLORIDE 0.9 % IV SOLN: INTRAVENOUS | Status: AC

## 2023-11-21 MED ORDER — METOCLOPRAMIDE HCL 5 MG/ML IJ SOLN: 5.0000 mg | Freq: Three times a day (TID) | INTRAMUSCULAR | Status: DC | PRN

## 2023-11-21 MED ORDER — 0.9 % SODIUM CHLORIDE (POUR BTL) OPTIME
TOPICAL | Status: DC | PRN
  Administered 2023-11-21: 1000 mL

## 2023-11-21 MED ORDER — PROPOFOL 10 MG/ML IV BOLUS
INTRAVENOUS | Status: DC | PRN
  Administered 2023-11-21: 20 mg via INTRAVENOUS
  Administered 2023-11-21: 180 mg via INTRAVENOUS

## 2023-11-21 MED ORDER — PRENATAL MULTIVITAMIN CH
1.0000 | ORAL_TABLET | Freq: Every day | ORAL | Status: DC
  Administered 2023-11-22 – 2023-11-23 (×2): 1 via ORAL
  Filled 2023-11-21 (×2): qty 1

## 2023-11-21 MED ORDER — HYDROMORPHONE HCL 1 MG/ML IJ SOLN
0.5000 mg | INTRAMUSCULAR | Status: DC | PRN
  Administered 2023-11-21 – 2023-11-23 (×7): 1 mg via INTRAVENOUS
  Filled 2023-11-21 (×8): qty 1

## 2023-11-21 MED ORDER — CHLORHEXIDINE GLUCONATE 0.12 % MT SOLN
OROMUCOSAL | Status: AC
  Administered 2023-11-21: 15 mL via OROMUCOSAL
  Filled 2023-11-21: qty 15

## 2023-11-21 MED ORDER — PROPOFOL 500 MG/50ML IV EMUL
INTRAVENOUS | Status: DC | PRN
  Administered 2023-11-21: 150 ug/kg/min via INTRAVENOUS

## 2023-11-21 MED ORDER — ROCURONIUM BROMIDE 10 MG/ML (PF) SYRINGE
PREFILLED_SYRINGE | INTRAVENOUS | Status: AC
  Filled 2023-11-21: qty 10

## 2023-11-21 MED ORDER — MORPHINE SULFATE (PF) 2 MG/ML IV SOLN
2.0000 mg | Freq: Once | INTRAVENOUS | Status: AC
  Administered 2023-11-21: 2 mg via INTRAVENOUS
  Filled 2023-11-21: qty 1

## 2023-11-21 MED ORDER — MUPIROCIN 2 % EX OINT
1.0000 | TOPICAL_OINTMENT | Freq: Two times a day (BID) | CUTANEOUS | Status: DC
  Administered 2023-11-21 – 2023-11-23 (×4): 1 via NASAL
  Filled 2023-11-21: qty 22

## 2023-11-21 MED ORDER — CEFAZOLIN SODIUM-DEXTROSE 2-4 GM/100ML-% IV SOLN
INTRAVENOUS | Status: AC
Start: 2023-11-21 — End: 2023-11-21
  Filled 2023-11-21: qty 100

## 2023-11-21 MED ORDER — ONDANSETRON HCL 4 MG/2ML IJ SOLN
4.0000 mg | Freq: Four times a day (QID) | INTRAMUSCULAR | Status: DC | PRN
  Filled 2023-11-21: qty 2

## 2023-11-21 MED ORDER — FENTANYL CITRATE (PF) 250 MCG/5ML IJ SOLN
INTRAMUSCULAR | Status: DC | PRN
  Administered 2023-11-21 (×10): 50 ug via INTRAVENOUS

## 2023-11-21 MED ORDER — OXYCODONE HCL 5 MG PO TABS
5.0000 mg | ORAL_TABLET | ORAL | Status: DC | PRN
  Administered 2023-11-21: 10 mg via ORAL
  Administered 2023-11-21: 5 mg via ORAL
  Administered 2023-11-21 – 2023-11-23 (×7): 10 mg via ORAL
  Filled 2023-11-21 (×10): qty 2

## 2023-11-21 MED ORDER — FENTANYL CITRATE (PF) 100 MCG/2ML IJ SOLN
50.0000 ug | Freq: Once | INTRAMUSCULAR | Status: AC
  Administered 2023-11-21: 50 ug via INTRAVENOUS

## 2023-11-21 MED ORDER — OXYCODONE HCL 5 MG/5ML PO SOLN: 5.0000 mg | Freq: Once | ORAL | Status: DC | PRN

## 2023-11-21 MED ORDER — FENTANYL CITRATE (PF) 100 MCG/2ML IJ SOLN
25.0000 ug | INTRAMUSCULAR | Status: DC | PRN
  Administered 2023-11-21 (×2): 50 ug via INTRAVENOUS

## 2023-11-21 MED ORDER — CYCLOBENZAPRINE HCL 5 MG PO TABS
5.0000 mg | ORAL_TABLET | Freq: Three times a day (TID) | ORAL | Status: DC | PRN
  Filled 2023-11-21: qty 1

## 2023-11-21 MED ORDER — METOCLOPRAMIDE HCL 5 MG/ML IJ SOLN
10.0000 mg | Freq: Once | INTRAMUSCULAR | Status: AC | PRN
  Administered 2023-11-21: 10 mg via INTRAVENOUS

## 2023-11-21 MED ORDER — MORPHINE SULFATE (PF) 2 MG/ML IV SOLN
1.0000 mg | Freq: Once | INTRAVENOUS | Status: AC
  Administered 2023-11-21: 1 mg via INTRAVENOUS
  Filled 2023-11-21: qty 1

## 2023-11-21 MED ORDER — CEFAZOLIN SODIUM-DEXTROSE 2-4 GM/100ML-% IV SOLN
2.0000 g | Freq: Three times a day (TID) | INTRAVENOUS | Status: AC
  Administered 2023-11-21 – 2023-11-22 (×3): 2 g via INTRAVENOUS
  Filled 2023-11-21 (×3): qty 100

## 2023-11-21 MED ORDER — FENTANYL CITRATE (PF) 250 MCG/5ML IJ SOLN
INTRAMUSCULAR | Status: AC
Start: 2023-11-21 — End: ?
  Filled 2023-11-21: qty 5

## 2023-11-21 MED ORDER — SUCCINYLCHOLINE CHLORIDE 200 MG/10ML IV SOSY
PREFILLED_SYRINGE | INTRAVENOUS | Status: DC | PRN
  Administered 2023-11-21: 120 mg via INTRAVENOUS

## 2023-11-21 MED ORDER — METOCLOPRAMIDE HCL 5 MG PO TABS
5.0000 mg | ORAL_TABLET | Freq: Three times a day (TID) | ORAL | Status: DC | PRN
  Administered 2023-11-22: 10 mg via ORAL
  Filled 2023-11-21: qty 2

## 2023-11-21 MED ORDER — VANCOMYCIN HCL 1000 MG IV SOLR
INTRAVENOUS | Status: DC | PRN
  Administered 2023-11-21: 1000 mg via TOPICAL

## 2023-11-21 MED ORDER — DIPHENHYDRAMINE HCL 12.5 MG/5ML PO ELIX: 12.5000 mg | ORAL_SOLUTION | ORAL | Status: DC | PRN

## 2023-11-21 MED ORDER — ONDANSETRON HCL 4 MG/2ML IJ SOLN
INTRAMUSCULAR | Status: DC | PRN
  Administered 2023-11-21: 4 mg via INTRAVENOUS

## 2023-11-21 MED ORDER — BUPIVACAINE HCL (PF) 0.25 % IJ SOLN
INTRAMUSCULAR | Status: AC
  Filled 2023-11-21: qty 30

## 2023-11-21 MED ORDER — ORAL CARE MOUTH RINSE: 15.0000 mL | Freq: Once | OROMUCOSAL | Status: AC

## 2023-11-21 MED ORDER — ONDANSETRON HCL 4 MG/2ML IJ SOLN
INTRAMUSCULAR | Status: AC
  Filled 2023-11-21: qty 2

## 2023-11-21 MED ORDER — POLYETHYLENE GLYCOL 3350 17 G PO PACK: 17.0000 g | PACK | Freq: Every day | ORAL | Status: DC | PRN

## 2023-11-21 MED ORDER — OXYCODONE HCL 5 MG PO TABS: 5.0000 mg | ORAL_TABLET | Freq: Once | ORAL | Status: DC | PRN

## 2023-11-21 MED ORDER — METOCLOPRAMIDE HCL 5 MG/ML IJ SOLN
INTRAMUSCULAR | Status: AC
Start: 2023-11-21 — End: 2023-11-21
  Filled 2023-11-21: qty 2

## 2023-11-21 SURGICAL SUPPLY — 48 items
BAG COUNTER SPONGE SURGICOUNT (BAG) ×2 IMPLANT
BENZOIN TINCTURE PRP APPL 2/3 (GAUZE/BANDAGES/DRESSINGS) IMPLANT
BIT DRILL CROWE POINT TWST 4.3 (DRILL) IMPLANT
BLADE SURG 10 STRL SS (BLADE) ×4 IMPLANT
BNDG COHESIVE 4X5 TAN STRL LF (GAUZE/BANDAGES/DRESSINGS) ×2 IMPLANT
BNDG ELASTIC 4INX 5YD STR LF (GAUZE/BANDAGES/DRESSINGS) IMPLANT
BNDG ELASTIC 4X5.8 VLCR STR LF (GAUZE/BANDAGES/DRESSINGS) ×2 IMPLANT
BNDG ELASTIC 6INX 5YD STR LF (GAUZE/BANDAGES/DRESSINGS) ×2 IMPLANT
BNDG GAUZE DERMACEA FLUFF 4 (GAUZE/BANDAGES/DRESSINGS) ×2 IMPLANT
BRUSH SCRUB EZ PLAIN DRY (MISCELLANEOUS) ×4 IMPLANT
CHLORAPREP W/TINT 26 (MISCELLANEOUS) ×2 IMPLANT
CLSR STERI-STRIP ANTIMIC 1/2X4 (GAUZE/BANDAGES/DRESSINGS) IMPLANT
COVER SURGICAL LIGHT HANDLE (MISCELLANEOUS) ×4 IMPLANT
DRAPE C-ARM 42X72 X-RAY (DRAPES) ×2 IMPLANT
DRAPE C-ARMOR (DRAPES) ×2 IMPLANT
DRAPE HALF SHEET 40X57 (DRAPES) ×4 IMPLANT
DRAPE IMP U-DRAPE 54X76 (DRAPES) ×4 IMPLANT
DRAPE INCISE IOBAN 66X45 STRL (DRAPES) IMPLANT
DRAPE SURG ORHT 6 SPLT 77X108 (DRAPES) ×4 IMPLANT
DRAPE U-SHAPE 47X51 STRL (DRAPES) ×2 IMPLANT
DRILL SHORT 4.3 INTERLOCK (DRILL) IMPLANT
DRSG ADAPTIC 3X8 NADH LF (GAUZE/BANDAGES/DRESSINGS) ×2 IMPLANT
ELECTRODE REM PT RTRN 9FT ADLT (ELECTROSURGICAL) ×2 IMPLANT
GAUZE SPONGE 4X4 12PLY STRL (GAUZE/BANDAGES/DRESSINGS) ×2 IMPLANT
GLOVE BIO SURGEON STRL SZ 6.5 (GLOVE) ×6 IMPLANT
GLOVE BIO SURGEON STRL SZ7.5 (GLOVE) ×8 IMPLANT
GLOVE BIOGEL PI IND STRL 6.5 (GLOVE) ×2 IMPLANT
GLOVE BIOGEL PI IND STRL 7.5 (GLOVE) ×2 IMPLANT
GOWN STRL REUS W/ TWL LRG LVL3 (GOWN DISPOSABLE) ×4 IMPLANT
GUIDEPIN 3.2X17.5 THRD DISP (PIN) IMPLANT
GUIDEWIRE BEAD TIP 100 ST (WIRE) IMPLANT
KIT BASIN OR (CUSTOM PROCEDURE TRAY) ×2 IMPLANT
KIT TURNOVER KIT B (KITS) ×2 IMPLANT
NAIL TIB AFFIX ST 9X320 (Nail) IMPLANT
PACK TOTAL JOINT (CUSTOM PROCEDURE TRAY) ×2 IMPLANT
PAD ARMBOARD POSITIONER FOAM (MISCELLANEOUS) ×4 IMPLANT
PADDING CAST ABS COTTON 4X4 ST (CAST SUPPLIES) IMPLANT
PADDING CAST ABS COTTON 6X4 NS (CAST SUPPLIES) IMPLANT
SCREW CORT AFFIX ST 5X26 (Screw) IMPLANT
SCREW CORT AFFIX ST 5X40 (Screw) IMPLANT
SCREW CORTICAL BONE 5X36 ST (Screw) IMPLANT
STAPLER VISISTAT 35W (STAPLE) ×2 IMPLANT
SUT MNCRL AB 3-0 PS2 18 (SUTURE) ×2 IMPLANT
SUT VIC AB 0 CT1 27XBRD ANBCTR (SUTURE) IMPLANT
SUT VIC AB 2-0 CT1 TAPERPNT 27 (SUTURE) IMPLANT
TOWEL GREEN STERILE (TOWEL DISPOSABLE) ×4 IMPLANT
TOWEL GREEN STERILE FF (TOWEL DISPOSABLE) ×2 IMPLANT
YANKAUER SUCT BULB TIP NO VENT (SUCTIONS) IMPLANT

## 2023-11-21 NOTE — Consult Note (Signed)
 Reason for Consult:right tibia fracture Referring Physician:   [name] is an 33 y.o. female.  HPI: [name] [name] is a 33 y.o. female G3 P2-0-0-2 currently [redacted] weeks gestation with female fetus who presented to the ED with concern for right lower leg deformity. She was walking her dog on a wet grassy hill when she slipped and her right leg buckled underneath of her and she heard a "crack". She reports pain in the right knee and right lower leg, ankle. She states that she has had "an easy" pregnancy thus far. No complications.   Past Medical History:  Diagnosis Date   Abnormal Pap smear of cervix    Anxiety    Breast feeding status of mother    Migraine     Past Surgical History:  Procedure Laterality Date   TONSILLECTOMY      Family History  Problem Relation Age of Onset   Polycystic ovary syndrome Mother    Endometriosis Mother    Sleep apnea Mother    Cancer Maternal Grandmother        uterine   Hypertension Paternal Grandmother    Diabetes Paternal Grandmother    Thyroid  disease Paternal Grandmother     Social History:  reports that she has quit smoking. Her smoking use included e-cigarettes and cigarettes. She has never been exposed to tobacco smoke. She has never used smokeless tobacco. She reports current alcohol use. She reports current drug use. Drug: Marijuana.  Allergies: No Known Allergies  Medications: I have reviewed the patient's current medications.  Results for orders placed or performed during the hospital encounter of 11/20/23 (from the past 48 hours)  CBC with Differential     Status: Abnormal   Collection Time: 11/20/23  9:40 PM  Result Value Ref Range   WBC 16.1 (H) 4.0 - 10.5 K/uL   RBC 3.71 (L) 3.87 - 5.11 MIL/uL   Hemoglobin 11.5 (L) 12.0 - 15.0 g/dL   HCT 69.6 (L) 29.5 - 28.4 %   MCV 95.4 80.0 - 100.0 fL   MCH 31.0 26.0 - 34.0 pg   MCHC 32.5 30.0 - 36.0 g/dL   RDW 13.2 44.0 - 10.2 %   Platelets 310 150 - 400 K/uL   nRBC 0.0 0.0 - 0.2  %   Neutrophils Relative % 75 %   Neutro Abs 11.9 (H) 1.7 - 7.7 K/uL   Lymphocytes Relative 19 %   Lymphs Abs 3.1 0.7 - 4.0 K/uL   Monocytes Relative 5 %   Monocytes Absolute 0.8 0.1 - 1.0 K/uL   Eosinophils Relative 1 %   Eosinophils Absolute 0.2 0.0 - 0.5 K/uL   Basophils Relative 0 %   Basophils Absolute 0.1 0.0 - 0.1 K/uL   Immature Granulocytes 0 %   Abs Immature Granulocytes 0.06 0.00 - 0.07 K/uL    Comment: Performed at [facility], [address], [place], [place] [address]  Basic metabolic panel     Status: Abnormal   Collection Time: 11/20/23  9:40 PM  Result Value Ref Range   Sodium 138 135 - 145 mmol/L   Potassium 3.7 3.5 - 5.1 mmol/L   Chloride 107 98 - 111 mmol/L   CO2 19 (L) 22 - 32 mmol/L   Glucose, Bld 98 70 - 99 mg/dL    Comment: Glucose reference range applies only to samples taken after fasting for at least 8 hours.   BUN 12 6 - 20 mg/dL   Creatinine, Ser 6.44 0.44 -  1.00 mg/dL   Calcium  9.1 8.9 - 10.3 mg/dL   GFR, Estimated >40 >98 mL/min    Comment: (NOTE) Calculated using the CKD-EPI Creatinine Equation (2021)    Anion gap 12 5 - 15    Comment: Performed at [facility], [address], [place], [place] [address]  hCG, quantitative, pregnancy     Status: Abnormal   Collection Time: 11/20/23  9:40 PM  Result Value Ref Range   hCG, Beta Chain, Quant, S 18,316 (H) <5 mIU/mL    Comment:          GEST. AGE      CONC.  (mIU/mL)   <=1 WEEK        5 - 50     2 WEEKS       50 - 500     3 WEEKS       100 - 10,000     4 WEEKS     1,000 - 30,000     5 WEEKS     3,500 - 115,000   6-8 WEEKS     12,000 - 270,000    12 WEEKS     15,000 - 220,000        FEMALE AND NON-PREGNANT FEMALE:     LESS THAN 5 mIU/mL Performed at [facility], [address], [place], [place] [address]   Surgical PCR screen     Status: None   Collection Time: 11/21/23  4:13 AM   Specimen: Nasal Mucosa; Nasal Swab  Result Value Ref Range   MRSA, PCR NEGATIVE NEGATIVE    Staphylococcus aureus NEGATIVE NEGATIVE    Comment: (NOTE) The Xpert SA Assay (FDA approved for NASAL specimens in patients 34 years of age and older), is one component of a comprehensive surveillance program. It is not intended to diagnose infection nor to guide or monitor treatment. Performed at [facility], [address] [place], [place] [address]   Basic metabolic panel     Status: Abnormal   Collection Time: 11/21/23  7:06 AM  Result Value Ref Range   Sodium 138 135 - 145 mmol/L   Potassium 3.7 3.5 - 5.1 mmol/L   Chloride 112 (H) 98 - 111 mmol/L   CO2 17 (L) 22 - 32 mmol/L   Glucose, Bld 87 70 - 99 mg/dL    Comment: Glucose reference range applies only to samples taken after fasting for at least 8 hours.   BUN 8 6 - 20 mg/dL   Creatinine, Ser 8.65 0.44 - 1.00 mg/dL   Calcium  8.2 (L) 8.9 - 10.3 mg/dL   GFR, Estimated >78 >46 mL/min    Comment: (NOTE) Calculated using the CKD-EPI Creatinine Equation (2021)    Anion gap 9 5 - 15    Comment: Performed at [facility], [address] [place], [place] [address]  CBC     Status: Abnormal   Collection Time: 11/21/23  7:06 AM  Result Value Ref Range   WBC 16.5 (H) 4.0 - 10.5 K/uL   RBC 3.79 (L) 3.87 - 5.11 MIL/uL   Hemoglobin 11.6 (L) 12.0 - 15.0 g/dL   HCT 28.4 (L) 13.2 - 44.0 %   MCV 93.7 80.0 - 100.0 fL   MCH 30.6 26.0 - 34.0 pg   MCHC 32.7 30.0 - 36.0 g/dL   RDW 10.2 72.5 - 36.6 %   Platelets 275 150 - 400 K/uL   nRBC 0.0 0.0 - 0.2 %  Comment: Performed at [facility], [address], [place], [place] [address]    DG Ankle 2 Views Right Result Date: 11/21/2023 EXAM: 2 VIEW(S) XRAY OF THE RIGHT ANKLE 11/20/2023 11:57:37 PM CLINICAL HISTORY: Post-reduction. Post reduction right ankle. Patient shielded/pregnant [redacted] weeks; injured walking dog on a wet, grassy hill when right leg folded under her. Patient stated she heard a crack. Presenting with obvious deformity to right leg above ankle. COMPARISON:  Right ankle radiographs earlier today. FINDINGS: BONES: Mildly displaced, comminuted distal tibial and fibular shaft fractures, with approximately half the shaft with medial displacement. Overlying splint obscures fine osseous detail. JOINTS: No joint dislocation. No significant joint effusion. SOFT TISSUES: The soft tissues are unremarkable. IMPRESSION: 1. Mildly displaced, comminuted distal tibial and fibular shaft fractures, as above. Electronically signed by: Zadie Herter MD 11/21/2023 12:05 AM EDT RP Workstation: [id number]   DG Knee 2 Views Right Result Date: 11/21/2023 EXAM: 1 or 2 VIEW(S) XRAY OF THE RIGHT KNEE 11/20/2023 11:57:37 PM COMPARISON: None available. CLINICAL HISTORY: Post-reduction. Post reduction right ankle. Patient shielded/pregnant [redacted] weeks; Injured walking dog on a wet, grassy hill when right leg folded under her. Patient stated she heard a crack. Presenting with obvious deformity to right leg above ankle. FINDINGS: BONES AND JOINTS: No acute fracture. No focal osseous lesion. No joint dislocation. No significant joint effusion. SOFT TISSUES: The soft tissues are unremarkable. IMPRESSION: 1. No acute osseous abnormality. Electronically signed by: Zadie Herter MD 11/21/2023 12:04 AM EDT RP Workstation: [id number]   DG Ankle 2 Views Right Result Date: 11/20/2023 EXAM: 2 VIEW(S) XRAY OF THE RIGHT ANKLE 11/20/2023 10:22:00 PM CLINICAL HISTORY: Pain. Fall, lower right leg pain. COMPARISON: None available. FINDINGS: BONES: Displaced, comminuted distal tibial shaft fracture with apex medial angulation. Displaced, mildly comminuted distal fibular shaft fracture with apex medial angulation. JOINTS: No joint dislocation. No significant joint effusion. SOFT TISSUES: The soft tissues are unremarkable. IMPRESSION: 1. Displaced, comminuted distal tibial and fibular shaft fractures, as above. Electronically signed by: Zadie Herter MD 11/20/2023 10:29 PM EDT RP Workstation: [id number]     Review of Systems  Constitutional:  Negative for appetite change, chills and fever.  Respiratory:  Negative for cough and shortness of breath.   Cardiovascular:  Negative for chest pain and palpitations.  Gastrointestinal:  Negative for abdominal pain.  Musculoskeletal:  Positive for arthralgias and joint swelling.  Neurological:  Negative for dizziness and numbness.  Psychiatric/Behavioral:  Negative for behavioral problems and confusion.    Blood pressure 133/77, pulse 80, temperature 98.2 F (36.8 C), temperature source Oral, resp. rate 18, height 5\' 3"  (1.6 m), weight 93 kg, SpO2 100%, currently breastfeeding. Physical Exam Constitutional:      General: She is not in acute distress.    Appearance: Normal appearance. She is normal weight. She is not ill-appearing.  HENT:     Head: Normocephalic and atraumatic.  Eyes:     Extraocular Movements: Extraocular movements intact.  Cardiovascular:     Rate and Rhythm: Normal rate.     Pulses: Normal pulses.  Pulmonary:     Effort: Pulmonary effort is normal. No respiratory distress.  Musculoskeletal:     Cervical back: Normal range of motion.     Comments: No tenderness to palpation about the right knee. No effusion noted right knee. Right LE in posterior splint. Able to move toes. Sensation to light touch intact in right foot. Cap refill <2 sec.   Skin:    Capillary Refill: Capillary refill takes less  than 2 seconds.  Neurological:     General: No focal deficit present.     Mental Status: She is alert and oriented to person, place, and time.     Assessment/Plan: Right distal tib/tib fracture Patient is being transported to the OR this morning for IM nail tibia. She has been NPO since midnight. Post instructions and follow up care per surgeon Dr Curtiss Dowdy.   [name] Vandermeer L. [place]field, PA-C 11/21/2023, 8:19 AM

## 2023-11-21 NOTE — Plan of Care (Signed)

## 2023-11-21 NOTE — ED Notes (Signed)
 This nurse called [facility] to is if they are ready for the patient, accepting nurse stated they are ready.

## 2023-11-21 NOTE — Op Note (Signed)
 Orthopaedic Surgery Operative Note (CSN: [id number] ) Date of Surgery: 11/21/2023  Admit Date: 11/20/2023   Diagnoses: Pre-Op Diagnoses: Right tibia/fibula fractures  Post-Op Diagnosis: Same  Procedures: CPT 27759-Intramedullary nailing of right tibia fracture CPT 27781-Nonoperative management of right fibula fracture  Surgeons : Primary: Laneta Pintos, MD  Assistant: Alona Jamaica, PA-C  Location: OR 3   Anesthesia: General   Antibiotics: Ancef 2g preop with 1 gm vancomycin powder placed topically   Tourniquet time: None    Estimated Blood Loss: 50 mL  Complications:* No complications entered in OR log *   Specimens:* No specimens in log *   Implants: Implant Name Type Inv. Item Serial No. Manufacturer Lot No. LRB No. Used Action  Affixus     [employer](ORTH,TRAU,BIO,SG) [id number] Right 1 Implanted  affixus bone screw cortical 5mm x 26mm    [employer](ORTH,TRAU,BIO,SG) [id number] Right 2 Implanted  SCREW CORT AFFIX ST 5X40 - [id number] Screw SCREW CORT AFFIX ST 5X40  [employer](ORTH,TRAU,BIO,SG) [id number] Right 1 Implanted  SCREW CORTICAL BONE 5X36 ST - [id number] Screw SCREW CORTICAL BONE 5X36 ST  [employer](ORTH,TRAU,BIO,SG) [id number] Right 1 Implanted     Indications for Surgery: 33 year old female who sustained a right tibial shaft and fibular shaft fracture.  Due to the unstable nature of her injury I recommend proceeding with intramedullary nailing of the right tibia.  Risks and benefits were discussed with the patient.  Risks included but not limited to bleeding, infection, malunion, nonunion, hardware failure, hardware irritation, nerve and blood vessel injury, DVT, even possibility anesthetic complications.  She agreed to proceed with surgery and consent was obtained.  Operative Findings: 1.  Intramedullary nailing of right tibial shaft fracture using [employer] Affixus 9 x 320 mm nail 2.  Nonoperative management of right fibular shaft  fracture.  Procedure: The patient was identified in the preoperative holding area. Consent was confirmed with the patient and their family and all questions were answered. The operative extremity was marked after confirmation with the patient. she was then brought back to the operating room by our anesthesia colleagues.  She was placed under general anesthetic and carefully transferred over to radiolucent flattop table.  Her abdomen and pelvis were covered with lead to protect the fetus from radiation.  A bump was placed under her operative hip.  The right lower extremity was then prepped and draped in usual sterile fashion.  Timeout was performed to verify the patient, the procedure, and the extremity.  Preoperative antibiotics were given.  Fluoroscopic imaging showed the unstable nature of her injury.  Lateral parapatellar incision was made and carried down through skin subcutaneous tissue.  I incised through the retinaculum and developed interval behind the patella tendon.  I then directed a threaded guidewire at appropriate starting point.  I advanced into the proximal metaphysis.  I confirmed positioning with fluoroscopy and then used an awl to enter the medullary canal.  I then passed a ball-tipped guidewire down the center of the canal crossing the fracture site and seated it into the distal metaphysis.  I then measured the length and chose to use a 320 mm nail.  I then sequentially reamed from 9 mm to 10.5 mm and obtained significant chatter.  I felt that a 9 mm nail was appropriate.  The 9 x 320 mm nail was then passed down the center canal attached to the targeting arm.  The fracture aligned appropriately and using perfect circle technique I placed 2 medial to lateral distal interlocking screws.  I then used the targeting arm to place 2 proximal interlocking screws.  The targeting arm was removed and final fluoroscopic imaging was obtained.  The incisions were copiously irrigated.  A gram of  vancomycin powder was placed into the incision.  A layered closure of 0 Vicryl, 2-0 Monocryl and 3-0 Monocryl with Dermabond was used to close the skin.  Sterile dressings were applied.  The patient was then awoke from anesthesia and taken to the PACU in stable condition.  Post Op Plan/Instructions: The patient will be weightbearing as tolerated to the right lower extremity.  She will receive postoperative Ancef.  She will receive aspirin for DVT prophylaxis.  Will have her mobilize with physical and Occupational Therapy.  I was present and performed the entire surgery.  Alona Jamaica, PA-C did assist me throughout the case. An assistant was necessary given the difficulty in approach, maintenance of reduction and ability to instrument the fracture.   Katheryne Pane, MD [facility]

## 2023-11-21 NOTE — H&P (Signed)
 History and Physical    [name] [name] WUJ:[id number] DOB: 1990/11/06 DOA: 11/20/2023  Patient coming from: Home.  Chief Complaint: Fall.  HPI: [name] [name] is a 33 y.o. female with no significant medical history presently [redacted] weeks pregnant had a fall while walking her dog.  Patient states she slipped on the yard and fell onto the floor.  Did not hit her head or lose consciousness.  ED Course: In the ER x-rays revealed distal right tibial and fibula fracture.  Dr. Deeann Fare on-call orthopedic surgeon was consulted.  Patient's OB/GYN also was contacted by the ER physician.  Labs show hemoglobin of 11.5 WBC 16.1.  Bicarb 19.  Anion gap 12.  Review of Systems: As per HPI, rest all negative.   Past Medical History:  Diagnosis Date   Abnormal Pap smear of cervix    Anxiety    Breast feeding status of mother    Migraine     Past Surgical History:  Procedure Laterality Date   TONSILLECTOMY       reports that she has quit smoking. Her smoking use included e-cigarettes and cigarettes. She has never been exposed to tobacco smoke. She has never used smokeless tobacco. She reports current alcohol use. She reports current drug use. Drug: Marijuana.  No Known Allergies  Family History  Problem Relation Age of Onset   Polycystic ovary syndrome Mother    Endometriosis Mother    Sleep apnea Mother    Cancer Maternal Grandmother        uterine   Hypertension Paternal Grandmother    Diabetes Paternal Grandmother    Thyroid  disease Paternal Grandmother     Prior to Admission medications   Medication Sig Start Date End Date Taking? Authorizing Provider  ferrous sulfate 325 (65 FE) MG tablet Take 325 mg by mouth daily with breakfast.   Yes [provider]  Prenatal MV & Min w/FA-DHA (PRENATAL GUMMIES PO) Take 2 tablets by mouth daily.   Yes [provider]    Physical Exam: Constitutional: Moderately built and nourished. Vitals:   11/21/23 0145 11/21/23 0200  11/21/23 0215 11/21/23 0302  BP: 128/73 126/67 119/75 133/77  Pulse: 79 80 75 80  Resp: (!) 22 (!) 21 15 18   Temp:    98.2 F (36.8 C)  TempSrc:    Oral  SpO2: 100% 98% 99% 100%  Weight:      Height:       Eyes: Anicteric no pallor. ENMT: No discharge from the ears/nose or mouth. Neck: No mass felt.  No neck rigidity. Respiratory: No rhonchi or crepitations. Cardiovascular: S1-S2 heard. Abdomen: Nontender bowel sounds present. Musculoskeletal: Right leg is in splint. Skin: No rash. Neurologic: Alert awake oriented to time place and person.  Moves all extremities. Psychiatric: Appears normal.  Normal affect.   Labs on Admission: I have personally reviewed following labs and imaging studies  CBC: Recent Labs  Lab 11/20/23 2140  WBC 16.1*  NEUTROABS 11.9*  HGB 11.5*  HCT 35.4*  MCV 95.4  PLT 310   Basic Metabolic Panel: Recent Labs  Lab 11/20/23 2140  NA 138  K 3.7  CL 107  CO2 19*  GLUCOSE 98  BUN 12  CREATININE 0.63  CALCIUM  9.1   GFR: Estimated Creatinine Clearance: 108.3 mL/min (by C-G formula based on SCr of 0.63 mg/dL). Liver Function Tests: No results for input(s): "AST", "ALT", "ALKPHOS", "BILITOT", "PROT", "ALBUMIN" in the last 168 hours. No results for input(s): "LIPASE", "AMYLASE" in the last 168  hours. No results for input(s): "AMMONIA" in the last 168 hours. Coagulation Profile: No results for input(s): "INR", "PROTIME" in the last 168 hours. Cardiac Enzymes: No results for input(s): "CKTOTAL", "CKMB", "CKMBINDEX", "TROPONINI" in the last 168 hours. BNP (last 3 results) No results for input(s): "PROBNP" in the last 8760 hours. HbA1C: No results for input(s): "HGBA1C" in the last 72 hours. CBG: No results for input(s): "GLUCAP" in the last 168 hours. Lipid Profile: No results for input(s): "CHOL", "HDL", "LDLCALC", "TRIG", "CHOLHDL", "LDLDIRECT" in the last 72 hours. Thyroid  Function Tests: No results for input(s): "TSH", "T4TOTAL",  "FREET4", "T3FREE", "THYROIDAB" in the last 72 hours. Anemia Panel: No results for input(s): "VITAMINB12", "FOLATE", "FERRITIN", "TIBC", "IRON", "RETICCTPCT" in the last 72 hours. Urine analysis:    Component Value Date/Time   COLORURINE YELLOW 03/09/2014 0942   APPEARANCEUR CLOUDY (A) 03/09/2014 0942   LABSPEC 1.025 03/09/2014 0942   PHURINE 7.0 03/09/2014 0942   GLUCOSEU NEGATIVE 03/09/2014 0942   HGBUR SMALL (A) 03/09/2014 0942   BILIRUBINUR NEGATIVE 03/09/2014 0942   KETONESUR NEGATIVE 03/09/2014 0942   PROTEINUR NEGATIVE 03/09/2014 0942   UROBILINOGEN 0.2 03/09/2014 0942   NITRITE NEGATIVE 03/09/2014 0942   LEUKOCYTESUR SMALL (A) 03/09/2014 0942   Sepsis Labs: @LABRCNTIP (procalcitonin:4,lacticidven:4) )No results found for this or any previous visit (from the past 240 hours).   Radiological Exams on Admission: DG Ankle 2 Views Right Result Date: 11/21/2023 EXAM: 2 VIEW(S) XRAY OF THE RIGHT ANKLE 11/20/2023 11:57:37 PM CLINICAL HISTORY: Post-reduction. Post reduction right ankle. Patient shielded/pregnant [redacted] weeks; injured walking dog on a wet, grassy hill when right leg folded under her. Patient stated she heard a crack. Presenting with obvious deformity to right leg above ankle. COMPARISON: Right ankle radiographs earlier today. FINDINGS: BONES: Mildly displaced, comminuted distal tibial and fibular shaft fractures, with approximately half the shaft with medial displacement. Overlying splint obscures fine osseous detail. JOINTS: No joint dislocation. No significant joint effusion. SOFT TISSUES: The soft tissues are unremarkable. IMPRESSION: 1. Mildly displaced, comminuted distal tibial and fibular shaft fractures, as above. Electronically signed by: Zadie Herter MD 11/21/2023 12:05 AM EDT RP Workstation: [id number]   DG Knee 2 Views Right Result Date: 11/21/2023 EXAM: 1 or 2 VIEW(S) XRAY OF THE RIGHT KNEE 11/20/2023 11:57:37 PM COMPARISON: None available. CLINICAL HISTORY:  Post-reduction. Post reduction right ankle. Patient shielded/pregnant [redacted] weeks; Injured walking dog on a wet, grassy hill when right leg folded under her. Patient stated she heard a crack. Presenting with obvious deformity to right leg above ankle. FINDINGS: BONES AND JOINTS: No acute fracture. No focal osseous lesion. No joint dislocation. No significant joint effusion. SOFT TISSUES: The soft tissues are unremarkable. IMPRESSION: 1. No acute osseous abnormality. Electronically signed by: Zadie Herter MD 11/21/2023 12:04 AM EDT RP Workstation: [id number]   DG Ankle 2 Views Right Result Date: 11/20/2023 EXAM: 2 VIEW(S) XRAY OF THE RIGHT ANKLE 11/20/2023 10:22:00 PM CLINICAL HISTORY: Pain. Fall, lower right leg pain. COMPARISON: None available. FINDINGS: BONES: Displaced, comminuted distal tibial shaft fracture with apex medial angulation. Displaced, mildly comminuted distal fibular shaft fracture with apex medial angulation. JOINTS: No joint dislocation. No significant joint effusion. SOFT TISSUES: The soft tissues are unremarkable. IMPRESSION: 1. Displaced, comminuted distal tibial and fibular shaft fractures, as above. Electronically signed by: Zadie Herter MD 11/20/2023 10:29 PM EDT RP Workstation: [id number]     Assessment/Plan Principal Problem:   Tibia/fibula fracture Active Problems:   Anemia   Tibial fracture    Right lower extremity distal tibia  and fibula fracture -    orthopedic has been consulted they are planning to do surgery in the morning.  Will keep patient n.p.o. and on pain medications.  Orthopedic has requested to closely monitor for any compartment syndrome. Anemia patient takes iron supplements.  Follow CBC. Leukocytosis could be reactionary.  No signs of infection.  Closely monitor. [redacted] weeks pregnant -ER physician had discussed with patient's OB Dr. Shoshana Dowse.  At this time OB/GYN agrees with plan for narcotics for pain due to acute traumatic injury.  Requesting fetal  heart tone monitoring immediately prior to and following surgery and then daily through the duration of hospital stay.  Dr. Pennie Box patient's primary OB/GYN is going to see the patient in consult.  Since patient has right tibia-fibula fracture requiring surgery will need close monitoring and more than 2 midnight stay.   DVT prophylaxis: SCDs for now. Code Status: Full code. Family Communication: Family at the bedside. Disposition Plan: Monitored bed. Consults called: Orthopedics and OB/GYN. Admission status: Observation.

## 2023-11-21 NOTE — Transfer of Care (Signed)
 Immediate Anesthesia Transfer of Care Note  Patient: [name] [name]  Procedure(s) Performed: INSERTION, INTRAMEDULLARY ROD, TIBIA (Right)  Patient Location: PACU  Anesthesia Type:General  Level of Consciousness: awake, alert , and oriented  Airway & Oxygen Therapy: Patient Spontanous Breathing and Patient connected to face mask oxygen  Post-op Assessment: Report given to RN and Post -op Vital signs reviewed and stable  Post vital signs: Reviewed and stable  Last Vitals:  Vitals Value Taken Time  BP 115/77 11/21/23 1100  Temp    Pulse 93 11/21/23 1101  Resp 18 11/21/23 1101  SpO2 94 % 11/21/23 1101  Vitals shown include unfiled device data.  Last Pain:  Vitals:   11/21/23 0908  TempSrc:   PainSc: 8          Complications: No notable events documented.

## 2023-11-21 NOTE — Anesthesia Postprocedure Evaluation (Signed)
 Anesthesia Post Note  Patient: SINDEL LEY  Procedure(s) Performed: INSERTION, INTRAMEDULLARY ROD, TIBIA (Right)     Patient location during evaluation: PACU Anesthesia Type: General Level of consciousness: awake and alert Pain management: pain level controlled Vital Signs Assessment: post-procedure vital signs reviewed and stable Respiratory status: spontaneous breathing, nonlabored ventilation and respiratory function stable Cardiovascular status: stable and blood pressure returned to baseline Anesthetic complications: no   No notable events documented.  Last Vitals:  Vitals:   11/21/23 1145 11/21/23 1205  BP: 122/73 116/74  Pulse: 84 81  Resp: 11 16  Temp:  36.7 C  SpO2: 97% 95%    Last Pain:  Vitals:   11/21/23 1205  TempSrc: Oral  PainSc:                  Juventino Oppenheim

## 2023-11-21 NOTE — Consult Note (Addendum)
 [facility]uma [facility] (OTS) Consult   Patient ID: [name] [name] MRN: [id number] DOB/AGE: [birth year] 33 y.o.  Reason for Consult:Right tibial/fibula fracture Referring Physician: Dr. Sherline Distel, MD [name] Other  HPI: [name] [name] is an 33 y.o. female who is being seen in consultation at the request of Dr. Deeann Fare for evaluation of right tibia and fibula fracture.  Patient had a fall while walking her dog.  She sustained a displaced tibia and fibula fracture.  She is [redacted] weeks pregnant.  Due to the OR availability and for complexity Dr. Deeann Fare felt that it be best treated by an orthopedic traumatologist.  Patient was seen and evaluated in the preoperative holding area.  Currently having a lot of pain.  Her husband is at bedside.  Denies any other injuries.  She works as a Haematologist.  She ambulates without assist device.  She has a few steps to enter her home.  Past Medical History:  Diagnosis Date   Abnormal Pap smear of cervix    Anxiety    Breast feeding status of mother    Migraine     Past Surgical History:  Procedure Laterality Date   TONSILLECTOMY      Family History  Problem Relation Age of Onset   Polycystic ovary syndrome Mother    Endometriosis Mother    Sleep apnea Mother    Cancer Maternal Grandmother        uterine   Hypertension Paternal Grandmother    Diabetes Paternal Grandmother    Thyroid  disease Paternal Grandmother     Social History:  reports that she has quit smoking. Her smoking use included e-cigarettes and cigarettes. She has never been exposed to tobacco smoke. She has never used smokeless tobacco. She reports current alcohol use. She reports current drug use. Drug: Marijuana.  Allergies: No Known Allergies  Medications:  No current facility-administered medications on file prior to encounter.   Current Outpatient Medications on File Prior to Encounter  Medication Sig Dispense Refill   ferrous sulfate 325 (65 FE) MG tablet  Take 325 mg by mouth daily with breakfast.     Prenatal MV & Min w/FA-DHA (PRENATAL GUMMIES PO) Take 2 tablets by mouth daily.       ROS: Constitutional: No fever or chills Vision: No changes in vision ENT: No difficulty swallowing CV: No chest pain Pulm: No SOB or wheezing GI: No nausea or vomiting GU: No urgency or inability to hold urine Skin: No poor wound healing Neurologic: No numbness or tingling Psychiatric: No depression or anxiety Heme: No bruising Allergic: No reaction to medications or food   Exam: Blood pressure (P) 128/62, pulse (P) 70, temperature (P) 98.3 F (36.8 C), temperature source (P) Oral, resp. rate (P) 18, height (P) 5\' 3"  (1.6 m), weight (P) 93 kg, SpO2 (P) 100%, currently breastfeeding. General: No acute distress Orientation: Awake alert and oriented x 3 Mood and Affect: Cooperative and pleasant Gait: Unable to assess due to her fracture Coordination and balance: Within normal limits  Right lower extremity: Short leg splint is in place and is clean dry intact.  Compartments are soft compressible.  She has active dorsiflexion plantarflexion of her toes.  She endorses sensation to the dorsum and plantar aspect of her foot.  She is warm well-perfused foot I did not take down her splint to palpate her pulses.  Left lower extremity: Skin without lesions. No tenderness to palpation. Full painless ROM, full strength in each muscle groups without evidence of instability.  Medical Decision Making: Data: Imaging: X-rays of the right tibia show a displaced tibia shaft fracture with associated fibular shaft fracture.  No obvious intra-articular extension.  Labs:  Results for orders placed or performed during the hospital encounter of 11/20/23 (from the past 24 hours)  CBC with Differential     Status: Abnormal   Collection Time: 11/20/23  9:40 PM  Result Value Ref Range   WBC 16.1 (H) 4.0 - 10.5 K/uL   RBC 3.71 (L) 3.87 - 5.11 MIL/uL   Hemoglobin 11.5 (L)  12.0 - 15.0 g/dL   HCT 86.5 (L) 78.4 - 69.6 %   MCV 95.4 80.0 - 100.0 fL   MCH 31.0 26.0 - 34.0 pg   MCHC 32.5 30.0 - 36.0 g/dL   RDW 29.5 28.4 - 13.2 %   Platelets 310 150 - 400 K/uL   nRBC 0.0 0.0 - 0.2 %   Neutrophils Relative % 75 %   Neutro Abs 11.9 (H) 1.7 - 7.7 K/uL   Lymphocytes Relative 19 %   Lymphs Abs 3.1 0.7 - 4.0 K/uL   Monocytes Relative 5 %   Monocytes Absolute 0.8 0.1 - 1.0 K/uL   Eosinophils Relative 1 %   Eosinophils Absolute 0.2 0.0 - 0.5 K/uL   Basophils Relative 0 %   Basophils Absolute 0.1 0.0 - 0.1 K/uL   Immature Granulocytes 0 %   Abs Immature Granulocytes 0.06 0.00 - 0.07 K/uL  Basic metabolic panel     Status: Abnormal   Collection Time: 11/20/23  9:40 PM  Result Value Ref Range   Sodium 138 135 - 145 mmol/L   Potassium 3.7 3.5 - 5.1 mmol/L   Chloride 107 98 - 111 mmol/L   CO2 19 (L) 22 - 32 mmol/L   Glucose, Bld 98 70 - 99 mg/dL   BUN 12 6 - 20 mg/dL   Creatinine, Ser 4.40 0.44 - 1.00 mg/dL   Calcium  9.1 8.9 - 10.3 mg/dL   GFR, Estimated >10 >27 mL/min   Anion gap 12 5 - 15  hCG, quantitative, pregnancy     Status: Abnormal   Collection Time: 11/20/23  9:40 PM  Result Value Ref Range   hCG, Beta Chain, Quant, S 18,316 (H) <5 mIU/mL  Surgical PCR screen     Status: None   Collection Time: 11/21/23  4:13 AM   Specimen: Nasal Mucosa; Nasal Swab  Result Value Ref Range   MRSA, PCR NEGATIVE NEGATIVE   Staphylococcus aureus NEGATIVE NEGATIVE  Basic metabolic panel     Status: Abnormal   Collection Time: 11/21/23  7:06 AM  Result Value Ref Range   Sodium 138 135 - 145 mmol/L   Potassium 3.7 3.5 - 5.1 mmol/L   Chloride 112 (H) 98 - 111 mmol/L   CO2 17 (L) 22 - 32 mmol/L   Glucose, Bld 87 70 - 99 mg/dL   BUN 8 6 - 20 mg/dL   Creatinine, Ser 2.53 0.44 - 1.00 mg/dL   Calcium  8.2 (L) 8.9 - 10.3 mg/dL   GFR, Estimated >66 >44 mL/min   Anion gap 9 5 - 15  CBC     Status: Abnormal   Collection Time: 11/21/23  7:06 AM  Result Value Ref Range    WBC 16.5 (H) 4.0 - 10.5 K/uL   RBC 3.79 (L) 3.87 - 5.11 MIL/uL   Hemoglobin 11.6 (L) 12.0 - 15.0 g/dL   HCT 03.4 (L) 74.2 - 59.5 %   MCV 93.7 80.0 - 100.0  fL   MCH 30.6 26.0 - 34.0 pg   MCHC 32.7 30.0 - 36.0 g/dL   RDW 95.6 38.7 - 56.4 %   Platelets 275 150 - 400 K/uL   nRBC 0.0 0.0 - 0.2 %     Imaging or Labs ordered: None  Medical history and chart was reviewed and case discussed with medical provider.  Assessment/Plan: 33 year old female [redacted] weeks pregnant with a right displaced tibia and fibular shaft fracture.  Due to the unstable nature of her injury I recommend proceeding with intramedullary nailing of the right tibia.  Risks and benefits were discussed with the patient.  Risks include but not limited to bleeding, infection, malunion, nonunion, hardware failure, hardware irritation, nerve and blood vessel injury, DVT, even the possibility anesthetic complications.  She agreed to proceed with surgery and consent was obtained.  The preop nursing team is reaching out to the OB/GYN to make sure there is no further workup needs to be done prior to surgical fixation.  Laneta Pintos, MD [facility] [phone] (office) [email or web address]

## 2023-11-21 NOTE — Discharge Instructions (Signed)
 [facility] Discharge Instructions   General Discharge Instructions  WEIGHT BEARING STATUS:weightbearing as tolerated  RANGE OF MOTION/ACTIVITY: ok for knee and ankle motion as tolerated  Wound Care: You may remove your surgical dressing on post op day 3 (Thursday 11/24/23). Incisions can be left open to air if there is no drainage. Once the incision is completely dry and without drainage, it may be left open to air out.  Showering may begin post op day 4 (Friday 11/25/23).  Clean incision gently with soap and water.  DVT/PE prophylaxis: Aspirin 162 mg daily x 30 days  Diet: as you were eating previously.  Can use over the counter stool softeners and bowel preparations, such as Miralax, to help with bowel movements.  Narcotics can be constipating.  Be sure to drink plenty of fluids  PAIN MEDICATION USE AND EXPECTATIONS  You have likely been given narcotic medications to help control your pain.  After a traumatic event that results in an fracture (broken bone) with or without surgery, it is ok to use narcotic pain medications to help control one's pain.  We understand that everyone responds to pain differently and each individual patient will be evaluated on a regular basis for the continued need for narcotic medications. Ideally, narcotic medication use should last no more than 6-8 weeks (coinciding with fracture healing).   As a patient it is your responsibility as well to monitor narcotic medication use and report the amount and frequency you use these medications when you come to your office visit.   We would also advise that if you are using narcotic medications, you should take a dose prior to therapy to maximize you participation.  IF YOU ARE ON NARCOTIC MEDICATIONS IT IS NOT PERMISSIBLE TO OPERATE A MOTOR VEHICLE (MOTORCYCLE/CAR/TRUCK/MOPED) OR HEAVY MACHINERY DO NOT MIX NARCOTICS WITH OTHER CNS (CENTRAL NERVOUS SYSTEM) DEPRESSANTS SUCH AS ALCOHOL  POST-OPERATIVE OPIOID  TAPER INSTRUCTIONS: It is important to wean off of your opioid medication as soon as possible. If you do not need pain medication after your surgery it is ok to stop day one. Opioids include: Codeine, Hydrocodone(Norco, Vicodin), Oxycodone (Percocet, oxycontin ) and hydromorphone amongst others.  Long term and even short term use of opiods can cause: Increased pain response Dependence Constipation Depression Respiratory depression And more.  Withdrawal symptoms can include Flu like symptoms Nausea, vomiting And more Techniques to manage these symptoms Hydrate well Eat regular healthy meals Stay active Use relaxation techniques(deep breathing, meditating, yoga) Do Not substitute Alcohol to help with tapering If you have been on opioids for less than two weeks and do not have pain than it is ok to stop all together.  Plan to wean off of opioids This plan should start within one week post op of your fracture surgery  Maintain the same interval or time between taking each dose and first decrease the dose.  Cut the total daily intake of opioids by one tablet each day Next start to increase the time between doses. The last dose that should be eliminated is the evening dose.    STOP SMOKING OR USING NICOTINE PRODUCTS!!!!  As discussed nicotine severely impairs your body's ability to heal surgical and traumatic wounds but also impairs bone healing.  Wounds and bone heal by forming microscopic blood vessels (angiogenesis) and nicotine is a vasoconstrictor (essentially, shrinks blood vessels).  Therefore, if vasoconstriction occurs to these microscopic blood vessels they essentially disappear and are unable to deliver necessary nutrients to the healing tissue.  This is  one modifiable factor that you can do to dramatically increase your chances of healing your injury.  (This means no smoking, no nicotine gum, patches, etc)  DO NOT USE NONSTEROIDAL ANTI-INFLAMMATORY DRUGS (NSAID'S)  Using  products such as Advil  (ibuprofen ), Aleve (naproxen), Motrin  (ibuprofen ) for additional pain control during fracture healing can delay and/or prevent the healing response.  If you would like to take over the counter (OTC) medication, Tylenol  (acetaminophen ) is ok.  However, some narcotic medications that are given for pain control contain acetaminophen  as well. Therefore, you should not exceed more than 4000 mg of tylenol  in a day if you do not have liver disease.  Also note that there are may OTC medicines, such as cold medicines and allergy medicines that my contain tylenol  as well.  If you have any questions about medications and/or interactions please ask your doctor/PA or your pharmacist.      ICE AND ELEVATE INJURED/OPERATIVE EXTREMITY  Using ice and elevating the injured extremity above your heart can help with swelling and pain control.  Icing in a pulsatile fashion, such as 20 minutes on and 20 minutes off, can be followed.    Do not place ice directly on skin. Make sure there is a barrier between to skin and the ice pack.    Using frozen items such as frozen peas works well as the conform nicely to the are that needs to be iced.  USE AN ACE WRAP OR TED HOSE FOR SWELLING CONTROL  In addition to icing and elevation, Ace wraps or TED hose are used to help limit and resolve swelling.  It is recommended to use Ace wraps or TED hose until you are informed to stop.    When using Ace Wraps start the wrapping distally (farthest away from the body) and wrap proximally (closer to the body)   Example: If you had surgery on your leg or thing and you do not have a splint on, start the ace wrap at the toes and work your way up to the thigh        If you had surgery on your upper extremity and do not have a splint on, start the ace wrap at your fingers and work your way up to the upper arm  IF YOU ARE IN A CAM BOOT (BLACK BOOT)  You may remove boot periodically. Perform daily dressing changes as noted  below.  Wash the liner of the boot regularly and wear a sock when wearing the boot. It is recommended that you sleep in the boot until told otherwise   CALL THE OFFICE FOR MEDICATION REFILLS OR WITH ANY QUESTIONS/CONCERNS: [phone]   VISIT OUR WEBSITE FOR ADDITIONAL INFORMATION: [email or web address]     Discharge Wound Care Instructions  Do NOT apply any ointments, solutions or lotions to pin sites or surgical wounds.  These prevent needed drainage and even though solutions like hydrogen peroxide kill bacteria, they also damage cells lining the pin sites that help fight infection.  Applying lotions or ointments can keep the wounds moist and can cause them to breakdown and open up as well. This can increase the risk for infection. When in doubt call the office.  Surgical incisions should be dressed daily.  If any drainage is noted, use one layer of adaptic or Mepitel, then gauze, Kerlix, and an ace wrap. - These dressing supplies should be available at local medical supply stores ([facility], [facility], etc) as well as Insurance claims handler ([facility], [facility], [place], etc)  Once  the incision is completely dry and without drainage, it may be left open to air out.  Showering may begin 36-48 hours later.  Cleaning gently with soap and water.   Call office for the following: Temperature greater than 101F Persistent nausea and vomiting Severe uncontrolled pain Redness, tenderness, or signs of infection (pain, swelling, redness, odor or green/yellow discharge around the site) Difficulty breathing, headache or visual disturbances Hives Persistent dizziness or light-headedness Extreme fatigue Any other questions or concerns you may have after discharge  In an emergency, call 911 or go to an Emergency Department at a nearby hospital  OTHER HELPFUL INFORMATION  If you had a block, it will wear off between 8-24 hrs postop typically.  This is period when your pain may go from nearly zero  to the pain you would have had postop without the block.  This is an abrupt transition but nothing dangerous is happening.  You may take an extra dose of narcotic when this happens.  You should wean off your narcotic medicines as soon as you are able.  Most patients will be off or using minimal narcotics before their first postop appointment.   We suggest you use the pain medication the first night prior to going to bed, in order to ease any pain when the anesthesia wears off. You should avoid taking pain medications on an empty stomach as it will make you nauseous.  Do not drink alcoholic beverages or take illicit drugs when taking pain medications.  In most states it is against the law to drive while you are in a splint or sling.  And certainly against the law to drive while taking narcotics.  You may return to work/school in the next couple of days when you feel up to it.   Pain medication may make you constipated.  Below are a few solutions to try in this order: Decrease the amount of pain medication if you aren't having pain. Drink lots of decaffeinated fluids. Drink prune juice and/or each dried prunes  If the first 3 don't work start with additional solutions Take Colace - an over-the-counter stool softener Take Senokot - an over-the-counter laxative Take Miralax - a stronger over-the-counter laxative

## 2023-11-21 NOTE — Plan of Care (Signed)
  Problem: Education: Goal: Knowledge of General Education information will improve Description: Including pain rating scale, medication(s)/side effects and non-pharmacologic comfort measures Outcome: Progressing   Problem: Clinical Measurements: Goal: Ability to maintain clinical measurements within normal limits will improve Outcome: Progressing   Problem: Activity: Goal: Risk for activity intolerance will decrease Outcome: Progressing   Problem: Nutrition: Goal: Adequate nutrition will be maintained Outcome: Progressing   Problem: Coping: Goal: Level of anxiety will decrease Outcome: Progressing   Problem: Elimination: Goal: Will not experience complications related to bowel motility Outcome: Progressing   Problem: Pain Managment: Goal: General experience of comfort will improve and/or be controlled Outcome: Progressing

## 2023-11-21 NOTE — Evaluation (Signed)
 Physical Therapy Evaluation Patient Details Name: [name] [name] MRN: [id number] DOB: Oct 08, 1990 Today's Date: 11/21/2023  History of Present Illness  33 y.o. female admitted 4/27 with a fractured Rt tibia/fibula after sustaining a fall. S/p Intramedullary nailing of right tibia fracture. She is [redacted] weeks pregnant and currently breastfeeding. PMH: anxiety, migraines.  Clinical Impression  Pt admitted with above diagnosis. S/p Rt tibial IM nail 4/28. Husband very supportive and able to take some time off to provide 24/7 assist initially. Has 3+1 steps to navigate to enter home which we need to teach pt to safely perform prior to d/c. Able to walk about 5' forward and backwards today away from bed with min assist, RW for support, and education for proper sequencing. Minimal WB tolerated through RLE at this time with CAM boot in place. Anticipate good functional progress and plan to visit again tomorrow to progress further as tolerated. Pt currently with functional limitations due to the deficits listed below (see PT Problem List). Pt will benefit from acute skilled PT to increase their independence and safety with mobility to allow discharge.           If plan is discharge home, recommend the following: A little help with walking and/or transfers;A little help with bathing/dressing/bathroom;Assistance with cooking/housework;Assist for transportation;Help with stairs or ramp for entrance   Can travel by private vehicle        Equipment Recommendations Rolling walker (2 wheels);Wheelchair (measurements PT);Wheelchair cushion (measurements PT) (w/c due to likelihood of multiple MD appointments while pregnant.)  Recommendations for Other Services       Functional Status Assessment Patient has had a recent decline in their functional status and demonstrates the ability to make significant improvements in function in a reasonable and predictable amount of time.     Precautions / Restrictions  Precautions Precautions: Fall Recall of Precautions/Restrictions: Intact Required Braces or Orthoses: Other Brace Other Brace: CAM boot Restrictions Weight Bearing Restrictions Per Provider Order: Yes RLE Weight Bearing Per Provider Order: Weight bearing as tolerated      Mobility  Bed Mobility Overal bed mobility: Needs Assistance Bed Mobility: Supine to Sit, Sit to Supine     Supine to sit: Min assist, HOB elevated Sit to supine: Contact guard assist   General bed mobility comments: Min assist to support RLE, able to rise to EOB with gentle guidance of RLE to floor. CGA to rise RLE back into bed, educated on use of LLE to support RLE into bed.    Transfers Overall transfer level: Needs assistance Equipment used: Rolling walker (2 wheels) Transfers: Sit to/from Stand Sit to Stand: Min assist           General transfer comment: Min assist for boost to stand, cues for hand placement. Minimal/no weight tolerated through RLE.    Ambulation/Gait Ambulation/Gait assistance: Min assist Gait Distance (Feet): 5 Feet (+5 backwards) Assistive device: Rolling walker (2 wheels) Gait Pattern/deviations: Step-to pattern, Decreased stride length, Decreased stance time - right, Decreased weight shift to right, Antalgic Gait velocity: dec Gait velocity interpretation: <1.31 ft/sec, indicative of household ambulator   General Gait Details: Educated on safe AD use with RW for support. Cues for sequencing. Min assist to help pt unload bodyweight when stepping forward with Lt foot. Minimal/no weight bearing tolerated through RLE. Might be more comfortable iwth shoe on Lt foot. CAM boot causing increased contact time with floor. Able to step with correct sequence backwards, smoothly.  Stairs  Wheelchair Mobility     Tilt Bed    Modified Rankin (Stroke Patients Only)       Balance Overall balance assessment: Needs assistance Sitting-balance support: No upper  extremity supported, Feet supported Sitting balance-Leahy Scale: Good     Standing balance support: Bilateral upper extremity supported, Reliant on assistive device for balance Standing balance-Leahy Scale: Poor                               Pertinent Vitals/Pain Pain Assessment Pain Assessment: Faces    Home Living Family/patient expects to be discharged to:: Private residence Living Arrangements: Children;Spouse/significant other Available Help at Discharge: Family;Available 24 hours/day (can arrange 24/7 for a while.) Type of Home: House Home Access: Stairs to enter Entrance Stairs-Rails: None Entrance Stairs-Number of Steps: 3 (+1 threshold)   Home Layout: Two level;Able to live on main level with bedroom/bathroom Home Equipment: Shower seat;Cane - single point      Prior Function Prior Level of Function : Independent/Modified Independent             Mobility Comments: ind active ADLs Comments: ind     Extremity/Trunk Assessment   Upper Extremity Assessment Upper Extremity Assessment: Defer to OT evaluation    Lower Extremity Assessment Lower Extremity Assessment: RLE deficits/detail RLE Deficits / Details: Able to wiggle toes, and SILT. Pain mostly in knee with flexion/extension. RLE: Unable to fully assess due to immobilization (CAM)       Communication   Communication Communication: No apparent difficulties    Cognition Arousal: Alert Behavior During Therapy: WFL for tasks assessed/performed   PT - Cognitive impairments: No apparent impairments                         Following commands: Intact       Cueing Cueing Techniques: Verbal cues     General Comments General comments (skin integrity, edema, etc.): Educated on importance of elevation, knee extension at rest, frequent flexion AROM as tolerated, and ice use.    Exercises General Exercises - Lower Extremity Quad Sets: Strengthening, Right, 5 reps, Supine Heel  Slides: AAROM, Right, 5 reps, Supine (Verbally reviewed) Hip Flexion/Marching: AAROM, Right, Strengthening, 5 reps, Supine (Verbally reviewed)   Assessment/Plan    PT Assessment Patient needs continued PT services  PT Problem List Decreased strength;Decreased range of motion;Decreased activity tolerance;Decreased balance;Decreased mobility;Decreased knowledge of precautions;Pain       PT Treatment Interventions DME instruction;Gait training;Stair training;Therapeutic activities;Functional mobility training;Therapeutic exercise;Balance training;Neuromuscular re-education;Patient/family education;Wheelchair mobility training;Modalities    PT Goals (Current goals can be found in the Care Plan section)  Acute Rehab PT Goals Patient Stated Goal: Get well, reduce pain PT Goal Formulation: With patient Time For Goal Achievement: 12/05/23 Potential to Achieve Goals: Good    Frequency Min 3X/week     Co-evaluation               AM-PAC PT "6 Clicks" Mobility  Outcome Measure Help needed turning from your back to your side while in a flat bed without using bedrails?: A Little Help needed moving from lying on your back to sitting on the side of a flat bed without using bedrails?: A Little Help needed moving to and from a bed to a chair (including a wheelchair)?: A Little Help needed standing up from a chair using your arms (e.g., wheelchair or bedside chair)?: A Little Help needed to walk in hospital  room?: A Little Help needed climbing 3-5 steps with a railing? : A Lot 6 Click Score: 17    End of Session   Activity Tolerance: Patient limited by pain Patient left: in bed;with call bell/phone within reach;with bed alarm set;with family/visitor present;with nursing/sitter in room (RLE elevated, iced) Nurse Communication: Mobility status PT Visit Diagnosis: Unsteadiness on feet (R26.81);Other abnormalities of gait and mobility (R26.89);History of falling (Z91.81);Pain Pain -  Right/Left: Right Pain - part of body: Leg    Time: 1718-1740 PT Time Calculation (min) (ACUTE ONLY): 22 min   Charges:   PT Evaluation $PT Eval Low Complexity: 1 Low   PT General Charges $$ ACUTE PT VISIT: 1 Visit         Jory Ng, PT, DPT [facility]  Rehabilitation Services Physical Therapist Office: [phone] Website: [email or web address]   Alinda Irani 11/21/2023, 5:57 PM

## 2023-11-21 NOTE — Progress Notes (Signed)
 Contacted MD regarding orders and pain meds. Pt came from the ED and was very agitated and crying in pain. Provider placed new orders and called to speak with me via phone to discuss these orders.

## 2023-11-21 NOTE — ED Provider Notes (Signed)
 I assisted with reduction.  Patient presents after accidental fall injuring her right lower extremity.  Patient is currently [redacted] weeks pregnant.  Denies any head injury, no abdominal pain or bleeding. Discussed extensively the risk and benefits of propofol in pregnancy and patient agreed to proceed with sedation  .Sedation  Date/Time: 11/21/2023 12:45 AM  Performed by: Eldon Greenland, MD Authorized by: Eldon Greenland, MD   Consent:    Consent obtained:  Written   Consent given by:  Patient Universal protocol:    Immediately prior to procedure, a time out was called: yes     Patient identity confirmed:  Provided demographic data and verbally with patient Indications:    Procedure performed:  Fracture reduction   Procedure necessitating sedation performed by:  Physician performing sedation Pre-sedation assessment:    Time since last food or drink:  4   ASA classification: class 1 - normal, healthy patient     Mouth opening:  3 or more finger widths   Mallampati score:  I - soft palate, uvula, fauces, pillars visible   Neck mobility: normal     Pre-sedation assessments completed and reviewed: airway patency, mental status and respiratory function   A pre-sedation assessment was completed prior to the start of the procedure Immediate pre-procedure details:    Reassessment: Patient reassessed immediately prior to procedure     Reviewed: vital signs, relevant labs/tests and NPO status     Verified: bag valve mask available, emergency equipment available, IV patency confirmed and oxygen available   Procedure details (see MAR for exact dosages):    Preoxygenation:  Nasal cannula   Sedation:  Propofol   Intended level of sedation: deep   Intra-procedure monitoring:  Blood pressure monitoring, frequent LOC assessments, frequent vital sign checks, continuous pulse oximetry, continuous capnometry and cardiac monitor   Intra-procedure events: none     Total Provider sedation time (minutes):   23 Post-procedure details:   A post-sedation assessment was completed following the completion of the procedure.   Attendance: Constant attendance by certified staff until patient recovered     Recovery: Patient returned to pre-procedure baseline     Post-sedation assessments completed and reviewed: airway patency, mental status and respiratory function     Patient is stable for discharge or admission: yes     Procedure completion:  Tolerated well, no immediate complications     Eldon Greenland, MD 11/21/23 0013

## 2023-11-21 NOTE — Anesthesia Preprocedure Evaluation (Addendum)
 Anesthesia Evaluation  Patient identified by MRN, date of birth, ID band Patient awake    Reviewed: Allergy & Precautions, NPO status , Patient's Chart, lab work & pertinent test results  History of Anesthesia Complications Negative for: history of anesthetic complications  Airway Mallampati: II  TM Distance: >3 FB Neck ROM: Full    Dental  (+) Dental Advisory Given, Teeth Intact   Pulmonary former smoker   Pulmonary exam normal        Cardiovascular negative cardio ROS Normal cardiovascular exam     Neuro/Psych  Headaches PSYCHIATRIC DISORDERS Anxiety Depression       GI/Hepatic negative GI ROS,,,(+)     substance abuse  marijuana use  Endo/Other   Obesity   Renal/GU negative Renal ROS     Musculoskeletal negative musculoskeletal ROS (+)    Abdominal   Peds  Hematology  (+) Blood dyscrasia, anemia   Anesthesia Other Findings   Reproductive/Obstetrics (+) Pregnancy                             Anesthesia Physical Anesthesia Plan  ASA: 2  Anesthesia Plan: General   Post-op Pain Management: Tylenol  PO (pre-op)*   Induction: Intravenous and Rapid sequence  PONV Risk Score and Plan: 3 and Treatment may vary due to age or medical condition, Ondansetron  and TIVA  Airway Management Planned: Oral ETT  Additional Equipment: None  Intra-op Plan:   Post-operative Plan: Extubation in OR  Informed Consent: I have reviewed the patients History and Physical, chart, labs and discussed the procedure including the risks, benefits and alternatives for the proposed anesthesia with the patient or authorized representative who has indicated his/her understanding and acceptance.     Dental advisory given  Plan Discussed with: CRNA and Anesthesiologist  Anesthesia Plan Comments: (BIS monitor. Plan for FHR tones in PACU per OB)       Anesthesia Quick Evaluation

## 2023-11-21 NOTE — Progress Notes (Signed)
 Progress Note   Patient: [name] [name] ZOX:[id number] DOB: Sep 08, 1990 DOA: 11/20/2023     0 DOS: the patient was seen and examined on 11/21/2023   Brief hospital course: [name] [name] is a 33 y.o. female with no significant medical history presently [redacted] weeks pregnant had a fall while walking her dog. She had right tib fib fracture admitted to [facility] service, with ortho consult.  Assessment and Plan: S/p mechanical fall - initial encounter Right tib-fib fracture S/p ORIF  Continue pain control with low dose opiates, tylenol . PT/ OT evaluation. Out of bed to chair. Lovenox for DVT.  17 week Pregnancy- Appreciate Gyn consult. Fetal monitoring per Gyn. Continue prenatal multivitamins, iron supplements.  Leukocytosis- likely reactive. Continue to monitor.      Subjective: Patient is seen and examined today after she came from OR. Has some soreness over knees. She asked me about ice pack over knee.  Physical Exam: Vitals:   11/21/23 1130 11/21/23 1145 11/21/23 1205 11/21/23 1556  BP: 126/73 122/73 116/74 129/73  Pulse: 74 84 81 80  Resp: 17 11 16 17   Temp:  98 F (36.7 C) 98.1 F (36.7 C) 98.3 F (36.8 C)  TempSrc:   Oral Oral  SpO2: 94% 97% 95% 98%  Weight:      Height:       my exam-Young Caucasian female no acute distress. left knee with brace noted. she  Data Reviewed:     Latest Ref Rng & Units 11/21/2023   12:24 PM 11/21/2023    7:06 AM 11/20/2023    9:40 PM  CBC  WBC 4.0 - 10.5 K/uL 16.9  16.5  16.1   Hemoglobin 12.0 - 15.0 g/dL 81.1  91.4  78.2   Hematocrit 36.0 - 46.0 % 34.9  35.5  35.4   Platelets 150 - 400 K/uL 324  275  310        Latest Ref Rng & Units 11/21/2023    7:06 AM 11/20/2023    9:40 PM 07/06/2022    7:44 PM  BMP  Glucose 70 - 99 mg/dL 87  98  90   BUN 6 - 20 mg/dL 8  12  10    Creatinine 0.44 - 1.00 mg/dL 9.56  2.13  0.86   Sodium 135 - 145 mmol/L 138  138  137   Potassium 3.5 - 5.1 mmol/L 3.7  3.7  3.5   Chloride 98 - 111 mmol/L 112   107  108   CO2 22 - 32 mmol/L 17  19  18    Calcium  8.9 - 10.3 mg/dL 8.2  9.1  9.1     DG Tibia/Fibula Right Port Result Date: 11/21/2023 CLINICAL DATA:  Follow up fractures post tibial intramedullary rod fixation. EXAM: PORTABLE RIGHT TIBIA AND FIBULA - 2 VIEW COMPARISON:  Intraoperative views same date. Radiographs 11/20/2023. FINDINGS: Right tibial intramedullary nail is secured by 2 proximal and 2 distal interlocking screws. Near anatomic reduction of the mildly comminuted distal tibial diaphyseal fracture. There is also near anatomic reduction of the mildly comminuted distal fibular diaphyseal fracture. No widening of the ankle mortise or complications identified. There is mild soft tissue swelling distally. IMPRESSION: Near anatomic reduction of the distal tibial and fibular diaphyseal fractures post tibial intramedullary rod fixation. Electronically Signed   By: Elmon Hagedorn M.D.   On: 11/21/2023 15:08   DG Tibia/Fibula Right Result Date: 11/21/2023 CLINICAL DATA:  ORIF. EXAM: RIGHT TIBIA AND FIBULA - 2 VIEW COMPARISON:  Right lower extremity  radiograph dated 11/21/2023. FINDINGS: Six intraoperative fluoroscopic spot images provided. The total fluoroscopic time is 1 minute 23 seconds and cumulative air kerma of 2.8 mGy. Status post ORIF of right tibial fracture with intramedullary nail. IMPRESSION: Intraoperative fluoroscopic spot images. Electronically Signed   By: Angus Bark M.D.   On: 11/21/2023 14:19   DG C-Arm 1-60 Min-No Report Result Date: 11/21/2023 Fluoroscopy was utilized by the requesting physician.  No radiographic interpretation.   DG Ankle 2 Views Right Result Date: 11/21/2023 EXAM: 2 VIEW(S) XRAY OF THE RIGHT ANKLE 11/20/2023 11:57:37 PM CLINICAL HISTORY: Post-reduction. Post reduction right ankle. Patient shielded/pregnant [redacted] weeks; injured walking dog on a wet, grassy hill when right leg folded under her. Patient stated she heard a crack. Presenting with obvious  deformity to right leg above ankle. COMPARISON: Right ankle radiographs earlier today. FINDINGS: BONES: Mildly displaced, comminuted distal tibial and fibular shaft fractures, with approximately half the shaft with medial displacement. Overlying splint obscures fine osseous detail. JOINTS: No joint dislocation. No significant joint effusion. SOFT TISSUES: The soft tissues are unremarkable. IMPRESSION: 1. Mildly displaced, comminuted distal tibial and fibular shaft fractures, as above. Electronically signed by: Zadie Herter MD 11/21/2023 12:05 AM EDT RP Workstation: [id number]   DG Knee 2 Views Right Result Date: 11/21/2023 EXAM: 1 or 2 VIEW(S) XRAY OF THE RIGHT KNEE 11/20/2023 11:57:37 PM COMPARISON: None available. CLINICAL HISTORY: Post-reduction. Post reduction right ankle. Patient shielded/pregnant [redacted] weeks; Injured walking dog on a wet, grassy hill when right leg folded under her. Patient stated she heard a crack. Presenting with obvious deformity to right leg above ankle. FINDINGS: BONES AND JOINTS: No acute fracture. No focal osseous lesion. No joint dislocation. No significant joint effusion. SOFT TISSUES: The soft tissues are unremarkable. IMPRESSION: 1. No acute osseous abnormality. Electronically signed by: Zadie Herter MD 11/21/2023 12:04 AM EDT RP Workstation: [id number]   DG Ankle 2 Views Right Result Date: 11/20/2023 EXAM: 2 VIEW(S) XRAY OF THE RIGHT ANKLE 11/20/2023 10:22:00 PM CLINICAL HISTORY: Pain. Fall, lower right leg pain. COMPARISON: None available. FINDINGS: BONES: Displaced, comminuted distal tibial shaft fracture with apex medial angulation. Displaced, mildly comminuted distal fibular shaft fracture with apex medial angulation. JOINTS: No joint dislocation. No significant joint effusion. SOFT TISSUES: The soft tissues are unremarkable. IMPRESSION: 1. Displaced, comminuted distal tibial and fibular shaft fractures, as above. Electronically signed by: Zadie Herter MD  11/20/2023 10:29 PM EDT RP Workstation: [id number]    Family Communication: Discussed the care plan with patient, she understands and agrees.  Disposition: Status is: Observation The patient remains OBS appropriate and will d/c before 2 midnights.  Planned Discharge Destination: Home with Home Health    Time spent: 40 minutes  Author: Aisha Hove, MD 11/21/2023 4:18 PM  For on call review [email or web address].

## 2023-11-21 NOTE — Progress Notes (Signed)
 Gynecology Note  At bedside to check on patient.  She has been up to the bathroom with assistance and worked with PT.   She denies obstetric complaints and had appropriate postprocedure fetal dopp tones.   BP 129/73   Pulse 80   Temp 98.3 F (36.8 C) (Oral)   Resp 17   Ht (P) 5\' 3"  (1.6 m)   Wt (P) 93 kg   LMP 07/19/2023   SpO2 98%   BMI (P) 36.31 kg/m   A/P: S/p ORIF for RLE fracture Again discussed pain control and narcotics are frequently used for pain control during pregnancy, and reasonable to require things given her fracture. Ideally, she uses the lowest effective amount for the shortest duration necessary.  She would like daily dopp tones while in patient. Order placed. Tylenol  is appropriate for pregnancy, but we avoid NSAIDs such as ibuprofen  Any pregnant patient is at increased risk for DVT. D/W orthopedics and would recommending limited ASA use to low dose option, which is otherwise frequently used for preeclampsia Ppx. Discussed DVT risk with patient. If she is unable to ambulate well, then would have a low threshold for prophylactic Lovenox (I.e. 40 mg SubQ daily).  Please reach out with any questions or concerns  Isidor Marek MD

## 2023-11-21 NOTE — Progress Notes (Signed)
 Pt labs were elevated per the ED I sent the provider a message making him aware.

## 2023-11-21 NOTE — TOC CAGE-AID Note (Signed)
 Transition of Care [facility]) - CAGE-AID Screening   Patient Details  Name: [name] [name] MRN: [id number] Date of Birth: 10-05-1990  [facility]) CM/SW Contact:    Leana Springston E Lidia Clavijo, LCSW Phone Number: 11/21/2023, 4:07 PM   Clinical Narrative: Patient denies substance use.   CAGE-AID Screening:    Have You Ever Felt You Ought to Cut Down on Your Drinking or Drug Use?: No Have People Annoyed You By Critizing Your Drinking Or Drug Use?: No Have You Felt Bad Or Guilty About Your Drinking Or Drug Use?: No Have You Ever Had a Drink or Used Drugs First Thing In The Morning to Steady Your Nerves or to Get Rid of a Hangover?: No CAGE-AID Score: 0  Substance Abuse Education Offered: No

## 2023-11-21 NOTE — Progress Notes (Signed)
 Orthopedic Tech Progress Note Patient Details:  [name] [name] 1991-01-08 [id number]  Ortho Devices Type of Ortho Device: Post (short leg) splint, Stirrup splint Ortho Device/Splint Location: rle Ortho Device/Splint Interventions: Ordered, Application, Adjustment  I applied splint during reduction. The patient was moving around a lot during application. Post Interventions Patient Tolerated: Well Instructions Provided: Care of device, Adjustment of device  [name] 11/21/2023, 12:17 AM

## 2023-11-21 NOTE — Progress Notes (Signed)
 Dr. Pennie Box from [facility] at the bedside to evaluate the pt.

## 2023-11-21 NOTE — ED Notes (Signed)
 ED TO INPATIENT HANDOFF REPORT  ED Nurse Name and Phone #: Angelina Kempf RN [phone]  S Name/Age/Gender [name] [name] 33 y.o. female Room/Bed: 028C/028C  Code Status   Code Status: Prior  Home/SNF/Other Home Patient oriented to: self, place, time, and situation Is this baseline? Yes   Triage Complete: Triage complete  Chief Complaint Tibial fracture [S82.209A]  Triage Note Patient was walking dog on a a wet, grassy hill when right leg folded under her. Patient stated she her a crack. Presenting with obvious deformity to right leg above ankle. No LOC or dizziness. Patient [redacted] weeks pregnant no complications or abdominal pain. Received 300 mcg of fentanyl   EMS VS 132/70 BP 80s hr 98% RA 35 end tidal 26RR    Allergies No Known Allergies  Level of Care/Admitting Diagnosis ED Disposition     ED Disposition  Admit   Condition  --   Comment  Hospital Area: [facility] [[id number]]  Level of Care: Telemetry Medical [104]  May place patient in observation at [facility] or [facility] if equivalent level of care is available:: No  Covid Evaluation: Asymptomatic - no recent exposure (last 10 days) testing not required  Diagnosis: Tibial fracture [595638]  Admitting Physician: Leocadia Rains  Attending Physician: Angelene Kelly Dulcian.Dow          B Medical/Surgery History Past Medical History:  Diagnosis Date   Abnormal Pap smear of cervix    Anxiety    Breast feeding status of mother    Migraine    Past Surgical History:  Procedure Laterality Date   TONSILLECTOMY       A IV Location/Drains/Wounds Patient Lines/Drains/Airways Status     Active Line/Drains/Airways     Name Placement date Placement time Site Days   Peripheral IV 11/20/23 22 G Posterior;Right Hand 11/20/23  2209  Hand  1            Intake/Output Last 24 hours No intake or output data in the 24 hours ending 11/21/23 0229  Labs/Imaging Results for  orders placed or performed during the hospital encounter of 11/20/23 (from the past 48 hours)  CBC with Differential     Status: Abnormal   Collection Time: 11/20/23  9:40 PM  Result Value Ref Range   WBC 16.1 (H) 4.0 - 10.5 K/uL   RBC 3.71 (L) 3.87 - 5.11 MIL/uL   Hemoglobin 11.5 (L) 12.0 - 15.0 g/dL   HCT 75.6 (L) 43.3 - 29.5 %   MCV 95.4 80.0 - 100.0 fL   MCH 31.0 26.0 - 34.0 pg   MCHC 32.5 30.0 - 36.0 g/dL   RDW 18.8 41.6 - 60.6 %   Platelets 310 150 - 400 K/uL   nRBC 0.0 0.0 - 0.2 %   Neutrophils Relative % 75 %   Neutro Abs 11.9 (H) 1.7 - 7.7 K/uL   Lymphocytes Relative 19 %   Lymphs Abs 3.1 0.7 - 4.0 K/uL   Monocytes Relative 5 %   Monocytes Absolute 0.8 0.1 - 1.0 K/uL   Eosinophils Relative 1 %   Eosinophils Absolute 0.2 0.0 - 0.5 K/uL   Basophils Relative 0 %   Basophils Absolute 0.1 0.0 - 0.1 K/uL   Immature Granulocytes 0 %   Abs Immature Granulocytes 0.06 0.00 - 0.07 K/uL    Comment: Performed at [facility], [address] [place], [place] [address]  Basic metabolic panel     Status: Abnormal   Collection  Time: 11/20/23  9:40 PM  Result Value Ref Range   Sodium 138 135 - 145 mmol/L   Potassium 3.7 3.5 - 5.1 mmol/L   Chloride 107 98 - 111 mmol/L   CO2 19 (L) 22 - 32 mmol/L   Glucose, Bld 98 70 - 99 mg/dL    Comment: Glucose reference range applies only to samples taken after fasting for at least 8 hours.   BUN 12 6 - 20 mg/dL   Creatinine, Ser 1.61 0.44 - 1.00 mg/dL   Calcium  9.1 8.9 - 10.3 mg/dL   GFR, Estimated >09 >60 mL/min    Comment: (NOTE) Calculated using the CKD-EPI Creatinine Equation (2021)    Anion gap 12 5 - 15    Comment: Performed at [facility], [address] [place], [place] [address]  hCG, quantitative, pregnancy     Status: Abnormal   Collection Time: 11/20/23  9:40 PM  Result Value Ref Range   hCG, Beta Chain, Quant, S 18,316 (H) <5 mIU/mL    Comment:          GEST. AGE      CONC.  (mIU/mL)   <=1 WEEK        5 - 50      2 WEEKS       50 - 500     3 WEEKS       100 - 10,000     4 WEEKS     1,000 - 30,000     5 WEEKS     3,500 - 115,000   6-8 WEEKS     12,000 - 270,000    12 WEEKS     15,000 - 220,000        FEMALE AND NON-PREGNANT FEMALE:     LESS THAN 5 mIU/mL Performed at [facility], [address] [place], [place] [address]    DG Ankle 2 Views Right Result Date: 11/21/2023 EXAM: 2 VIEW(S) XRAY OF THE RIGHT ANKLE 11/20/2023 11:57:37 PM CLINICAL HISTORY: Post-reduction. Post reduction right ankle. Patient shielded/pregnant [redacted] weeks; injured walking dog on a wet, grassy hill when right leg folded under her. Patient stated she heard a crack. Presenting with obvious deformity to right leg above ankle. COMPARISON: Right ankle radiographs earlier today. FINDINGS: BONES: Mildly displaced, comminuted distal tibial and fibular shaft fractures, with approximately half the shaft with medial displacement. Overlying splint obscures fine osseous detail. JOINTS: No joint dislocation. No significant joint effusion. SOFT TISSUES: The soft tissues are unremarkable. IMPRESSION: 1. Mildly displaced, comminuted distal tibial and fibular shaft fractures, as above. Electronically signed by: Zadie Herter MD 11/21/2023 12:05 AM EDT RP Workstation: [id number]   DG Knee 2 Views Right Result Date: 11/21/2023 EXAM: 1 or 2 VIEW(S) XRAY OF THE RIGHT KNEE 11/20/2023 11:57:37 PM COMPARISON: None available. CLINICAL HISTORY: Post-reduction. Post reduction right ankle. Patient shielded/pregnant [redacted] weeks; Injured walking dog on a wet, grassy hill when right leg folded under her. Patient stated she heard a crack. Presenting with obvious deformity to right leg above ankle. FINDINGS: BONES AND JOINTS: No acute fracture. No focal osseous lesion. No joint dislocation. No significant joint effusion. SOFT TISSUES: The soft tissues are unremarkable. IMPRESSION: 1. No acute osseous abnormality. Electronically signed by: Zadie Herter MD  11/21/2023 12:04 AM EDT RP Workstation: [id number]   DG Ankle 2 Views Right Result Date: 11/20/2023 EXAM: 2 VIEW(S) XRAY OF THE RIGHT ANKLE 11/20/2023 10:22:00 PM CLINICAL HISTORY: Pain. Fall, lower right leg pain. COMPARISON: None  available. FINDINGS: BONES: Displaced, comminuted distal tibial shaft fracture with apex medial angulation. Displaced, mildly comminuted distal fibular shaft fracture with apex medial angulation. JOINTS: No joint dislocation. No significant joint effusion. SOFT TISSUES: The soft tissues are unremarkable. IMPRESSION: 1. Displaced, comminuted distal tibial and fibular shaft fractures, as above. Electronically signed by: Zadie Herter MD 11/20/2023 10:29 PM EDT RP Workstation: [id number]    Pending Labs Unresulted Labs (From admission, onward)    None       Vitals/Pain Today's Vitals   11/21/23 0050 11/21/23 0054 11/21/23 0100 11/21/23 0201  BP: 129/67  127/75   Pulse: 76  74   Resp: 15  15   Temp:      TempSrc:      SpO2: 100%  97%   Weight:      Height:      PainSc:  10-Worst pain ever  4     Isolation Precautions No active isolations  Medications Medications  propofol (DIPRIVAN) 10 mg/mL bolus/IV push 46.5 mg (46.5 mg Intravenous Not Given 11/21/23 0004)  fentaNYL  (SUBLIMAZE ) injection 100 mcg (100 mcg Intravenous Given 11/20/23 2209)  0.9 %  sodium chloride  infusion (1,000 mLs Intravenous New Bag/Given 11/20/23 2330)  fentaNYL  (SUBLIMAZE ) injection 100 mcg (100 mcg Intravenous Given 11/20/23 2347)  morphine (PF) 2 MG/ML injection 2 mg (2 mg Intravenous Given 11/21/23 0054)  ondansetron  (ZOFRAN ) injection 4 mg (4 mg Intravenous Given 11/21/23 0055)    Mobility Normally walks, cannot due to fracture     Focused Assessments Musculoskeletal assessment   R Recommendations: See Admitting Provider Note  Report given to:   Additional Notes:

## 2023-11-21 NOTE — Anesthesia Procedure Notes (Signed)
 Procedure Name: Intubation Date/Time: 11/21/2023 9:43 AM  Performed by: Andee Bamberger, CRNAPre-anesthesia Checklist: Patient identified, Emergency Drugs available, Suction available and Patient being monitored Patient Re-evaluated:Patient Re-evaluated prior to induction Oxygen Delivery Method: Circle system utilized Preoxygenation: Pre-oxygenation with 100% oxygen Induction Type: IV induction and Rapid sequence Laryngoscope Size: Mac and 4 Grade View: Grade I Tube type: Oral Tube size: 7.0 mm Number of attempts: 1 Airway Equipment and Method: Stylet and Oral airway Placement Confirmation: ETT inserted through vocal cords under direct vision, positive ETCO2 and breath sounds checked- equal and bilateral Secured at: 23 cm Tube secured with: Tape Dental Injury: Teeth and Oropharynx as per pre-operative assessment

## 2023-11-21 NOTE — Progress Notes (Signed)
 Gynecology Note  [name] [name] is a 33 y.o. G3P2 at [redacted] weeks gestation that is s/p R tibial fracture due to fall.   Her prenatal course has otherwise been uncomplicated. She denies abdominal trauma, pain or vaginal bleeding.   BP (P) 128/62   Pulse (P) 70   Temp (P) 98.3 F (36.8 C) (Oral)   Resp (P) 18   Ht (P) 5\' 3"  (1.6 m)   Wt (P) 93 kg   LMP 07/19/2023   SpO2 (P) 100%   BMI (P) 36.31 kg/m   Gen: alert, well appearing, no distress Chest: nonlabored breathing CV: no peripheral edema Abdomen: soft, nontender Ext: R leg wrapped and supported  FHT were 142 BPM in ED.  A/P: Recommend assessing fetal well being with fetal heart tones following procedure.  She is scheduled for routine anatomy US  in our office in ~ 2 weeks, but will have the office reach out following discharge to check in and offer sooner appointment. Discussed pain control with patient, and narcotics are frequently used for pain control during pregnancy, and reasonable to require things given her fracture. Ideally, she uses the lowest effective amount for the shortest duration necessary.  Tylenol  is appropriate for pregnancy, but we avoid NSAIDs such as ibuprofen  Any pregnant patient is at increased risk for DVT.  Pending recovery and ambulation, or if orthopedics recommend, may consider prophylactic lovenox.   Please reach out with any questions or concerns.   Isidor Marek MD [facility]

## 2023-11-21 NOTE — Progress Notes (Signed)
 Orthopedic Tech Progress Note Patient Details:  [name] [name] [birth year] [id number]  Ortho Devices Type of Ortho Device: CAM walker Ortho Device/Splint Location: RLE Ortho Device/Splint Interventions: Ordered, Application   Post Interventions Patient Tolerated: Well Instructions Provided: Care of device, Adjustment of device  Darsi Tien A Lindley Stachnik 11/21/2023, 12:41 PM

## 2023-11-21 NOTE — Progress Notes (Signed)
   11/21/23 1423  TOC Brief Assessment  Insurance and Status Reviewed  Patient has primary care physician Yes  Home environment has been reviewed spouse  Prior level of function: independent  Prior/Current Home Services No current home services  Social Drivers of Health Review SDOH reviewed no interventions necessary  Readmission risk has been reviewed Yes  Transition of care needs  (await PT recommendations)       Transition of Care Department (TOC) has reviewed patient and  will continue to monitor patient advancement through interdisciplinary progression rounds. If new patient transition needs arise, please place a TOC consult.

## 2023-11-21 NOTE — ED Notes (Signed)
 This nurse called lab to see if the hCG quantitative could be added to the labs that were previously sent. Lab technician stated that she would look for the labs sent and add on the hCG quantitative.

## 2023-11-21 NOTE — ED Provider Notes (Signed)
 [place] EMERGENCY DEPARTMENT AT [facility] Provider Note   CSN: [id number] Arrival date & time: 11/20/23  2126     History  Chief Complaint  Patient presents with  . Fall  . Leg Injury    [name] [name] is a 33 y.o. female G3 P2-0-0-2 currently [redacted] weeks gestation with female fetus who presents with concern for right lower leg deformity. She was walking her dog on a wet grassy hill when she slipped and her right leg buckled underneath of her and she heard a "crack". NO pain elsewhere. Patient tearful.  Following with [facility], Dr. Pennie Box.  Patient received 300 mcg of fentanyl  en route with EMS with normal vital signs.  No presyncope prior to fall.   HPI     Home Medications Prior to Admission medications   Medication Sig Start Date End Date Taking? Authorizing Provider  Naltrexone-buPROPion HCl ER (CONTRAVE ) 8-90 MG TB12 Start 1 tablet every morning for 7 days, then 1 tablet twice daily for 7 days, then 2 tablets every morning and one every evening for 7 days, then 2 tablets in the morning and 2 tablets in the evening. 04/18/23   McElwee, Adolfo Hooker, NP      Allergies    Patient has no known allergies.    Review of Systems   Review of Systems  Musculoskeletal:         Right lower leg deformity.    Physical Exam Updated Vital Signs BP 121/71 (BP Location: Right Arm)   Pulse 89   Temp (!) 97.2 F (36.2 C) (Oral)   Resp 20   Ht 5\' 3"  (1.6 m)   Wt 93 kg   SpO2 100%   BMI 36.31 kg/m  Physical Exam Vitals and nursing note reviewed.  Constitutional:      Appearance: She is obese. She is not ill-appearing or toxic-appearing.  HENT:     Head: Normocephalic and atraumatic.     Mouth/Throat:     Mouth: Mucous membranes are moist.     Pharynx: No oropharyngeal exudate or posterior oropharyngeal erythema.  Eyes:     General:        Right eye: No discharge.        Left eye: No discharge.     Conjunctiva/sclera: Conjunctivae normal.   Cardiovascular:     Rate and Rhythm: Normal rate and regular rhythm.     Pulses: Normal pulses.     Heart sounds: Normal heart sounds. No murmur heard. Pulmonary:     Effort: Pulmonary effort is normal. No respiratory distress.     Breath sounds: Normal breath sounds. No wheezing or rales.  Abdominal:     General: Bowel sounds are normal. There is no distension.     Palpations: Abdomen is soft.     Tenderness: There is no abdominal tenderness. There is no guarding or rebound.  Musculoskeletal:     Cervical back: Neck supple.     Right lower leg: Deformity present. No edema.     Left lower leg: No edema.     Right ankle: Normal.     Right Achilles Tendon: Normal.     Left ankle: Normal.     Left Achilles Tendon: Normal.     Right foot: Normal.     Left foot: Normal.       Legs:     Comments: Compartments are soft in the right lower leg, 2+ pedal pulses bilaterally.  Skin:    General: Skin  is warm and dry.     Capillary Refill: Capillary refill takes less than 2 seconds.  Neurological:     General: No focal deficit present.     Mental Status: She is alert and oriented to person, place, and time. Mental status is at baseline.  Psychiatric:        Mood and Affect: Mood normal.     ED Results / Procedures / Treatments   Labs (all labs ordered are listed, but only abnormal results are displayed) Labs Reviewed  CBC WITH DIFFERENTIAL/PLATELET - Abnormal; Notable for the following components:      Result Value   WBC 16.1 (*)    RBC 3.71 (*)    Hemoglobin 11.5 (*)    HCT 35.4 (*)    Neutro Abs 11.9 (*)    All other components within normal limits  BASIC METABOLIC PANEL WITH GFR - Abnormal; Notable for the following components:   CO2 19 (*)    All other components within normal limits    EKG None  Radiology DG Ankle 2 Views Right Result Date: 11/20/2023 EXAM: 2 VIEW(S) XRAY OF THE RIGHT ANKLE 11/20/2023 10:22:00 PM CLINICAL HISTORY: Pain. Fall, lower right leg pain.  COMPARISON: None available. FINDINGS: BONES: Displaced, comminuted distal tibial shaft fracture with apex medial angulation. Displaced, mildly comminuted distal fibular shaft fracture with apex medial angulation. JOINTS: No joint dislocation. No significant joint effusion. SOFT TISSUES: The soft tissues are unremarkable. IMPRESSION: 1. Displaced, comminuted distal tibial and fibular shaft fractures, as above. Electronically signed by: Zadie Herter MD 11/20/2023 10:29 PM EDT RP Workstation: [id number]    Procedures .Reduction of fracture  Date/Time: 11/21/2023 12:09 AM  Performed by: Kae Oram, PA-C Authorized by: Kae Oram, PA-C  Consent: Verbal consent obtained. Risks and benefits: risks, benefits and alternatives were discussed Consent given by: patient Patient understanding: patient states understanding of the procedure being performed Patient consent: the patient's understanding of the procedure matches consent given Procedure consent: procedure consent matches procedure scheduled Relevant documents: relevant documents present and verified Test results: test results available and properly labeled Imaging studies: imaging studies available Patient identity confirmed: verbally with patient and arm band Time out: Immediately prior to procedure a "time out" was called to verify the correct patient, procedure, equipment, support staff and site/side marked as required. Local anesthesia used: no  Anesthesia: Local anesthesia used: no  Sedation: Patient sedated: yes Sedation type: moderate (conscious) sedation Sedatives: propofol Analgesia: fentanyl   Patient tolerance: patient tolerated the procedure well with no immediate complications       Medications Ordered in ED Medications  propofol (DIPRIVAN) 10 mg/mL bolus/IV push 46.5 mg (has no administration in time range)  fentaNYL  (SUBLIMAZE ) injection 100 mcg (100 mcg Intravenous Given 11/20/23 2209)  0.9  %  sodium chloride  infusion (1,000 mLs Intravenous New Bag/Given 11/20/23 2330)  fentaNYL  (SUBLIMAZE ) injection 100 mcg (100 mcg Intravenous Given 11/20/23 2347)    ED Course/ Medical Decision Making/ A&P Clinical Course as of 11/21/23 0138  Sun Nov 20, 2023  2229 DG Ankle 2 Views Right [RS]  2246 Consult to Dr. Deeann Fare, ortho, who agrees that this patient will require ORIF. Will not be able to go to the OR until morning. He is requesting that we reduce and splint the fracture in the ED. He is also requesting admission to medicine service. I appreciate his collaboration in the care of this patient.  [RS]  2253 Consult to [facility] OB, Dr. Shoshana Dowse.  She agrees  with plan for narcotics as needed for acute traumatic injury.  Patient is previable therefore does not require admission to women's.  She is recommending fetal heart tones immediately prior to and following surgery and then daily through duration of hospital stay.  She states Dr. Pennie Box, patient's primary OB, is on for tomorrow Monday 4/28 and will round on the patient in the hospital.  I appreciate her collaboration care of this patient. [RS]  2359 Consult Dr. [facility] who is agreeable to admit this patient to her service.  Appreciate his collaboration in the care of this patient. [RS]  Mon Nov 21, 2023  0015 Repeat FHT following sedation 149. [RS]  0029 2+ pedal pulses on the R, compartments remain soft.   [RS]  0137 2+ pedal pulses on the R foot, compartments remain soft.  [RS]    Clinical Course User Index [RS] Gladiola Madore, Adelle Agent, PA-C                                 Medical Decision Making 33 year old female with right lower extremity deformity after mechanical fall. [redacted] weeks pregnant.   Mildly to given contact for tearful.  Cardiopulmonary and abdominal exams are benign.  Fetal heart tones 142.  No instability of the pelvis, tender to palpation of the right knee, symmetric pulses and soft compartments of the right  lower extremity.  Amount and/or Complexity of Data Reviewed Labs: ordered.    Details: CBC with leukocytosis of 16, anemia with hemoglobin 11.5.  BMP unremarkable. Radiology: ordered. Decision-making details documented in ED Course.    Details: X-ray with displaced comminuted distal tibial and fibular shaft fractures with apex medial angulation.  Postreduction films with interval mild improvement in alignment though continues to be malaligned.  Unstable fracture.    Risk Prescription drug management. Decision regarding hospitalization.   Patient will require ORIF for lower extremity fracture.  Compartments remain soft, symmetric pedal pulses intact.  Patient admitted to hospital medicine as above, will go to the OR with Dr. Deeann Fare and on-call [facility] will round on the patient in the morning.  Will continue to monitor her compartments and pulses while she remains in our department.  [name] [name] and her husband  voiced understanding of her medical evaluation and treatment plan. Each of their questions answered to their expressed satisfaction.  Return precautions were given.  Patient is well-appearing, stable, and was discharged in good condition.  This chart was dictated using voice recognition software, Dragon. Despite the best efforts of this provider to proofread and correct errors, errors may still occur which can change documentation meaning.         Final Clinical Impression(s) / ED Diagnoses Final diagnoses:  None    Rx / DC Orders ED Discharge Orders     None         Arlyne Lame 11/21/23 0014    Eldon Greenland, MD 11/21/23 [phone]

## 2023-11-21 NOTE — ED Provider Notes (Incomplete)
 [place] EMERGENCY DEPARTMENT AT [facility] Provider Note   CSN: [id number] Arrival date & time: 11/20/23  2126     History {Add pertinent medical, surgical, social history, OB history to HPI:1} Chief Complaint  Patient presents with  . Fall  . Leg Injury    [name] [name] is a 33 y.o. female.  HPI     Home Medications Prior to Admission medications   Medication Sig Start Date End Date Taking? Authorizing Provider  Naltrexone-buPROPion HCl ER (CONTRAVE ) 8-90 MG TB12 Start 1 tablet every morning for 7 days, then 1 tablet twice daily for 7 days, then 2 tablets every morning and one every evening for 7 days, then 2 tablets in the morning and 2 tablets in the evening. 04/18/23   McElwee, Adolfo Hooker, NP      Allergies    Patient has no known allergies.    Review of Systems   Review of Systems  Physical Exam Updated Vital Signs BP 121/71 (BP Location: Right Arm)   Pulse 89   Temp (!) 97.2 F (36.2 C) (Oral)   Resp 20   Ht 5\' 3"  (1.6 m)   Wt 93 kg   SpO2 100%   BMI 36.31 kg/m  Physical Exam  ED Results / Procedures / Treatments   Labs (all labs ordered are listed, but only abnormal results are displayed) Labs Reviewed  CBC WITH DIFFERENTIAL/PLATELET - Abnormal; Notable for the following components:      Result Value   WBC 16.1 (*)    RBC 3.71 (*)    Hemoglobin 11.5 (*)    HCT 35.4 (*)    Neutro Abs 11.9 (*)    All other components within normal limits  BASIC METABOLIC PANEL WITH GFR - Abnormal; Notable for the following components:   CO2 19 (*)    All other components within normal limits    EKG None  Radiology DG Ankle 2 Views Right Result Date: 11/20/2023 EXAM: 2 VIEW(S) XRAY OF THE RIGHT ANKLE 11/20/2023 10:22:00 PM CLINICAL HISTORY: Pain. Fall, lower right leg pain. COMPARISON: None available. FINDINGS: BONES: Displaced, comminuted distal tibial shaft fracture with apex medial angulation. Displaced, mildly comminuted distal fibular shaft  fracture with apex medial angulation. JOINTS: No joint dislocation. No significant joint effusion. SOFT TISSUES: The soft tissues are unremarkable. IMPRESSION: 1. Displaced, comminuted distal tibial and fibular shaft fractures, as above. Electronically signed by: Zadie Herter MD 11/20/2023 10:29 PM EDT RP Workstation: [id number]    Procedures Procedures  {Document cardiac monitor, telemetry assessment procedure when appropriate:1}  Medications Ordered in ED Medications  propofol (DIPRIVAN) 10 mg/mL bolus/IV push 46.5 mg (has no administration in time range)  fentaNYL  (SUBLIMAZE ) injection 100 mcg (100 mcg Intravenous Given 11/20/23 2209)  0.9 %  sodium chloride  infusion (1,000 mLs Intravenous New Bag/Given 11/20/23 2330)  fentaNYL  (SUBLIMAZE ) injection 100 mcg (100 mcg Intravenous Given 11/20/23 2347)    ED Course/ Medical Decision Making/ A&P Clinical Course as of 11/21/23 0000  Sun Nov 20, 2023  2229 DG Ankle 2 Views Right [RS]  2246 Consult to Dr. Deeann Fare, ortho, who agrees that this patient will require ORIF. Will not be able to go to the OR until morning. He is requesting that we reduce and splint the fracture in the ED. He is also requesting admission to medicine service. I appreciate his collaboration in the care of this patient.  [RS]  2253 Consult to [facility], Dr. Shoshana Dowse.  She agrees with plan for  narcotics as needed for acute traumatic injury.  Patient is previable therefore does not require admission to women's.  She is recommending fetal heart tones immediately prior to and following surgery and then daily through duration of hospital stay.  She states Dr. Pennie Box, patient's primary OB, is on for tomorrow Monday 4/28 and will round on the patient in the hospital.  I appreciate her collaboration care of this patient. [RS]  2359 Consult Dr. [facility] who is agreeable to admit this patient to her service.  Appreciate his collaboration in the care of this patient. [RS]     Clinical Course User Index [RS] Mallery Harshman, Adelle Agent, PA-C   {   Click here for ABCD2, HEART and other calculatorsREFRESH Note before signing :1}                              Medical Decision Making Amount and/or Complexity of Data Reviewed Labs: ordered. Radiology: ordered. Decision-making details documented in ED Course.  Risk Prescription drug management.   ***  {Document critical care time when appropriate:1} {Document review of labs and clinical decision tools ie heart score, Chads2Vasc2 etc:1}  {Document your independent review of radiology images, and any outside records:1} {Document your discussion with family members, caretakers, and with consultants:1} {Document social determinants of health affecting pt's care:1} {Document your decision making why or why not admission, treatments were needed:1} Final Clinical Impression(s) / ED Diagnoses Final diagnoses:  None    Rx / DC Orders ED Discharge Orders     None

## 2023-11-22 ENCOUNTER — Encounter (HOSPITAL_COMMUNITY): Payer: Self-pay | Admitting: Student

## 2023-11-22 DIAGNOSIS — S82871A Displaced pilon fracture of right tibia, initial encounter for closed fracture: Secondary | ICD-10-CM | POA: Diagnosis not present

## 2023-11-22 DIAGNOSIS — D72829 Elevated white blood cell count, unspecified: Secondary | ICD-10-CM | POA: Diagnosis not present

## 2023-11-22 DIAGNOSIS — D649 Anemia, unspecified: Secondary | ICD-10-CM | POA: Diagnosis not present

## 2023-11-22 DIAGNOSIS — Z3A17 17 weeks gestation of pregnancy: Secondary | ICD-10-CM | POA: Diagnosis not present

## 2023-11-22 LAB — CBC
HCT: 34.8 % — ABNORMAL LOW (ref 36.0–46.0)
Hemoglobin: 11.7 g/dL — ABNORMAL LOW (ref 12.0–15.0)
MCH: 31.2 pg (ref 26.0–34.0)
MCHC: 33.6 g/dL (ref 30.0–36.0)
MCV: 92.8 fL (ref 80.0–100.0)
Platelets: 335 10*3/uL (ref 150–400)
RBC: 3.75 MIL/uL — ABNORMAL LOW (ref 3.87–5.11)
RDW: 13 % (ref 11.5–15.5)
WBC: 14.9 10*3/uL — ABNORMAL HIGH (ref 4.0–10.5)
nRBC: 0 % (ref 0.0–0.2)

## 2023-11-22 MED ORDER — VITAMIN D 25 MCG (1000 UNIT) PO TABS
1000.0000 [IU] | ORAL_TABLET | Freq: Every day | ORAL | Status: DC
  Administered 2023-11-22 – 2023-11-23 (×2): 1000 [IU] via ORAL
  Filled 2023-11-22 (×2): qty 1

## 2023-11-22 NOTE — Progress Notes (Signed)
 Orthopaedic Trauma Progress Note  SUBJECTIVE: Doing okay this morning.  Had increase in pain overnight but this has improved with medications.  She worked with therapies earlier, notes moderate soreness/pain in the knee currently.  No chest pain. No SOB. No nausea/vomiting. No other complaints.  Tolerating diet and fluids.  Patient significant other at bedside  OBJECTIVE:  Vitals:   11/21/23 2306 11/22/23 0315  BP: 111/74 111/70  Pulse: 78 76  Resp: 18 17  Temp: 98.4 F (36.9 C) 98.2 F (36.8 C)  SpO2: 98% 95%    Opiates Today (MME): Today's  total administered Morphine Milligram Equivalents: 55 Opiates Yesterday (MME): Yesterday's total administered Morphine Milligram Equivalents: 331.5  General: Sitting up in bedside chair, no acute distress Respiratory: No increased work of breathing.  Operative Extremity (right lower extremity): CAM boot in place.  Dressing clean, dry, intact.  Soreness about the knee.  Tolerates gentle knee range of motion.  Able to wiggle toes.  Endorses sensation over all aspects of the foot.  Toes are well-perfused.    IMAGING: Stable post op imaging.   LABS:  Results for orders placed or performed during the hospital encounter of 11/20/23 (from the past 24 hours)  VITAMIN D 25 Hydroxy (Vit-D Deficiency, Fractures)     Status: Abnormal   Collection Time: 11/21/23 12:24 PM  Result Value Ref Range   Vit D, 25-Hydroxy 27.35 (L) 30 - 100 ng/mL  CBC     Status: Abnormal   Collection Time: 11/21/23 12:24 PM  Result Value Ref Range   WBC 16.9 (H) 4.0 - 10.5 K/uL   RBC 3.77 (L) 3.87 - 5.11 MIL/uL   Hemoglobin 11.5 (L) 12.0 - 15.0 g/dL   HCT 40.9 (L) 81.1 - 91.4 %   MCV 92.6 80.0 - 100.0 fL   MCH 30.5 26.0 - 34.0 pg   MCHC 33.0 30.0 - 36.0 g/dL   RDW 78.2 95.6 - 21.3 %   Platelets 324 150 - 400 K/uL   nRBC 0.0 0.0 - 0.2 %  CBC     Status: Abnormal   Collection Time: 11/22/23  8:24 AM  Result Value Ref Range   WBC 14.9 (H) 4.0 - 10.5 K/uL   RBC 3.75 (L)  3.87 - 5.11 MIL/uL   Hemoglobin 11.7 (L) 12.0 - 15.0 g/dL   HCT 08.6 (L) 57.8 - 46.9 %   MCV 92.8 80.0 - 100.0 fL   MCH 31.2 26.0 - 34.0 pg   MCHC 33.6 30.0 - 36.0 g/dL   RDW 62.9 52.8 - 41.3 %   Platelets 335 150 - 400 K/uL   nRBC 0.0 0.0 - 0.2 %    ASSESSMENT: AILIN YANTIS is a 33 y.o. female, 1 Day Post-Op s/p mechanical fall Procedures: INTRAMEDULLARY NAILING RIGHT TIBIA SHAFT FRACTURE  CV/Blood loss: Hemoglobin 11.7 this morning.  Stable from preop.  Hemodynamically stable  PLAN: Weightbearing: WBAT RLE in cam boot ROM: Okay for knee and ankle motion as tolerated Incisional and dressing care: Reinforce dressings as needed  Showering: Okay to begin showering getting incisions wet starting 11/24/2023 Orthopedic device(s): CAM boot when OOB and mobilizing Pain management:  1. Tylenol  [phone] mg q 6 hours scheduled 2. Flexeril 5 mg 3 times daily PRN 3. Oxycodone  5-10 mg q 4 hours PRN 4. Dilaudid 0.5-1 mg q 3 hours PRN VTE prophylaxis: Aspirin 162 mg, SCDs ID:  Ancef 2gm post op Foley/Lines:  No foley, KVO IVFs Impediments to Fracture Healing: Vitamin D level 27, started on  daily supplementation Dispo: PT/OT evaluation today, would anticipate the patient will be ready for discharge home in the next 24 to 48 hours.  Will plan to remove Ace wrap dressing from RLE tomorrow 11/23/2023.  Okay for discharge from ortho standpoint once cleared by medicine team and therapies  D/C recommendations: - Tylenol , oxycodone , Flexeril as needed for pain control - Aspirin 162 mg daily x 30 days for DVT prophylaxis - Continue 1000 units Vit D supplementation daily x 90 days  Follow - up plan: 2 weeks after d/c for wound check and repeat x-rays   Contact information:  Katheryne Pane MD, Alona Jamaica PA-C. After hours and holidays please check [email or web address] for group call information for [facility]   Edilia Gordon, PA-C [phone] (office) [email or web address]

## 2023-11-22 NOTE — Plan of Care (Signed)
  Problem: Pain Managment: Goal: General experience of comfort will improve and/or be controlled Outcome: Progressing   Problem: Safety: Goal: Ability to remain free from injury will improve Outcome: Progressing

## 2023-11-22 NOTE — Progress Notes (Signed)
 Physical Therapy Treatment Patient Details Name: [name] [name] MRN: [id number] DOB: 1991/06/22 Today's Date: 11/22/2023   History of Present Illness 33 y.o. female admitted 4/27 with a fractured Rt tibia/fibula after sustaining a fall. S/p Intramedullary nailing of right tibia fracture. She is [redacted] weeks pregnant and currently breastfeeding. PMH: anxiety, migraines.    PT Comments  Pt tolerates treatment well despite reports of increased pain today. Pt is able to ambulate for short distances, with increased weightbearing through RLE. PT assists the pt in initiating stair training, practicing ascending one step backward. Pt's spouse plans to measure step dimensions at the front entrance to see if RW can fit on each step. PT will follow up tomorrow for further mobility and stair training.    If plan is discharge home, recommend the following: A little help with walking and/or transfers;A little help with bathing/dressing/bathroom;Assistance with cooking/housework;Assist for transportation;Help with stairs or ramp for entrance   Can travel by private vehicle        Equipment Recommendations  Rolling walker (2 wheels);Wheelchair (measurements PT);Wheelchair cushion (measurements PT) (pt may elect to purchase a wheelchair or transport chair independently)    Recommendations for Other Services       Precautions / Restrictions Precautions Precautions: Fall Recall of Precautions/Restrictions: Intact Required Braces or Orthoses: Other Brace Splint/Cast - Date Prophylactic Dressing Applied (if applicable): 11/21/23 Other Brace: CAM boot Restrictions Weight Bearing Restrictions Per Provider Order: Yes RLE Weight Bearing Per Provider Order: Weight bearing as tolerated Other Position/Activity Restrictions: WBAT in CAM boot     Mobility  Bed Mobility Overal bed mobility: Needs Assistance Bed Mobility: Supine to Sit, Sit to Supine     Supine to sit: Min assist Sit to supine: Min assist    General bed mobility comments: assist for RLE    Transfers Overall transfer level: Needs assistance Equipment used: Rolling walker (2 wheels) Transfers: Sit to/from Stand Sit to Stand: Contact guard assist                Ambulation/Gait Ambulation/Gait assistance: Contact guard assist Gait Distance (Feet): 15 Feet Assistive device: Rolling walker (2 wheels) Gait Pattern/deviations: Step-to pattern Gait velocity: reduced Gait velocity interpretation: <1.31 ft/sec, indicative of household ambulator   General Gait Details: slowed step-to gait, reduced stance time and weightbearing through RLE   Stairs Stairs: Yes Stairs assistance: Contact guard assist Stair Management: No rails, With walker, Backwards Number of Stairs: 1 (1 steps x 2 trials, attempted to ascend a 2nd step backward but unable to negotiate at this time)     Wheelchair Mobility     Tilt Bed    Modified Rankin (Stroke Patients Only)       Balance Overall balance assessment: Needs assistance Sitting-balance support: No upper extremity supported, Feet supported Sitting balance-Leahy Scale: Good     Standing balance support: Single extremity supported, Reliant on assistive device for balance Standing balance-Leahy Scale: Poor                              Communication Communication Communication: No apparent difficulties  Cognition Arousal: Alert Behavior During Therapy: WFL for tasks assessed/performed   PT - Cognitive impairments: No apparent impairments                         Following commands: Intact      Cueing Cueing Techniques: Verbal cues  Exercises Other Exercises Other Exercises: PT encourages  heel slides to aide in improving R knee ROM as well as passive knee flexion when sitting in chair or at the edge of bed for short 10-15 minute periods    General Comments General comments (skin integrity, edema, etc.): VSS on RA      Pertinent Vitals/Pain  Pain Assessment Pain Assessment: Faces Faces Pain Scale: Hurts even more Pain Location: RLE Pain Descriptors / Indicators: Grimacing Pain Intervention(s): Monitored during session    Home Living                          Prior Function            PT Goals (current goals can now be found in the care plan section) Acute Rehab PT Goals Patient Stated Goal: Get well, reduce pain Progress towards PT goals: Progressing toward goals    Frequency    Min 3X/week      PT Plan      Co-evaluation              AM-PAC PT "6 Clicks" Mobility   Outcome Measure  Help needed turning from your back to your side while in a flat bed without using bedrails?: A Little Help needed moving from lying on your back to sitting on the side of a flat bed without using bedrails?: A Little Help needed moving to and from a bed to a chair (including a wheelchair)?: A Little Help needed standing up from a chair using your arms (e.g., wheelchair or bedside chair)?: A Little Help needed to walk in hospital room?: A Little Help needed climbing 3-5 steps with a railing? : A Lot 6 Click Score: 17    End of Session Equipment Utilized During Treatment: Gait belt Activity Tolerance: Patient tolerated treatment well Patient left: in bed;with call bell/phone within reach;with family/visitor present Nurse Communication: Mobility status PT Visit Diagnosis: Unsteadiness on feet (R26.81);Other abnormalities of gait and mobility (R26.89);History of falling (Z91.81);Pain Pain - Right/Left: Right Pain - part of body: Leg     Time: 6295-2841 PT Time Calculation (min) (ACUTE ONLY): 38 min  Charges:    $Gait Training: 23-37 mins $Therapeutic Activity: 8-22 mins PT General Charges $$ ACUTE PT VISIT: 1 Visit                     Rexie Catena, PT, DPT Acute Rehabilitation Office [phone]    Rexie Catena 11/22/2023, 1:20 PM

## 2023-11-22 NOTE — TOC Initial Note (Addendum)
 Transition of Care [facility]) - Initial/Assessment Note    Patient Details  Name: [name] [name] MRN: [id number] Date of Birth: 1991/07/20  Transition of Care [facility]) CM/SW Contact:    Terre Ferri, RN Phone Number: 11/22/2023, 10:15 AM  Clinical Narrative:                  Spoke to patient and husband at bedside.   Discussed PT/OT recommendations: OP PT when cleared by Ortho , 3 in1 , walker and wheelchair.   Patient in agreement with DME. NCM explained insurance may not cover walker and wheelchair at same time. [employer] will discuss her insurance coverage. Patient voiced understanding. NCM will entered orders and progress note and asked MD to sign   Expected Discharge Plan: Home/Self Care Barriers to Discharge: Continued Medical Work up   Patient Goals and CMS Choice Patient states their goals for this hospitalization and ongoing recovery are:: to return to home CMS Medicare.gov Compare Post Acute Care list provided to:: Patient Choice offered to / list presented to : Patient      Expected Discharge Plan and Services   Discharge Planning Services: CM Consult Post Acute Care Choice: Durable Medical Equipment Living arrangements for the past 2 months: Single Family Home                 DME Arranged: Walker rolling, Wheelchair manual, 3-N-1   Date DME Agency Contacted: 11/22/23 Time DME Agency Contacted: 1014 Representative spoke with at DME Agency: Raechel Bulla HH Arranged: NA          Prior Living Arrangements/Services Living arrangements for the past 2 months: Single Family Home Lives with:: Spouse Patient language and need for interpreter reviewed:: Yes Do you feel safe going back to the place where you live?: Yes      Need for Family Participation in Patient Care: Yes (Comment) Care giver support system in place?: Yes (comment)   Criminal Activity/Legal Involvement Pertinent to Current Situation/Hospitalization: No - Comment as needed  Activities of Daily  Living   ADL Screening (condition at time of admission) Independently performs ADLs?: Yes (appropriate for developmental age) Is the patient deaf or have difficulty hearing?: No Does the patient have difficulty seeing, even when wearing glasses/contacts?: No Does the patient have difficulty concentrating, remembering, or making decisions?: No  Permission Sought/Granted   Permission granted to share information with : Yes, Verbal Permission Granted  Share Information with NAME: husband Rozelle Corning  Permission granted to share info w AGENCY: [employer]        Emotional Assessment Appearance:: Appears stated age Attitude/Demeanor/Rapport: Engaged Affect (typically observed): Pleasant Orientation: : Oriented to Self, Oriented to Place, Oriented to  Time, Oriented to Situation Alcohol / Substance Use: Not Applicable Psych Involvement: No (comment)  Admission diagnosis:  Tibial fracture [S82.209A] Closed fracture of right tibia and fibula, initial encounter [S82.201A, S82.401A] Patient Active Problem List   Diagnosis Date Noted   Tibia/fibula fracture 11/21/2023   Anemia 11/21/2023   Tibial fracture 11/21/2023   Leucocytosis 11/21/2023   [redacted] weeks gestation of pregnancy 11/21/2023   Anxiety and depression 07/15/2022   Palpitations 07/15/2022   Obesity (BMI 30-39.9) 07/15/2022   PCP:  Odette Benjamin, NP Pharmacy:   [id number] Jonette Nestle, [place] - [address] [place] [address] Phone: [phone] Fax: [phone]     Social Drivers of Health (SDOH) Social History: SDOH Screenings   Food Insecurity: No Food  Insecurity (11/21/2023)  Housing: Low Risk  (11/21/2023)  Transportation Needs: No Transportation Needs (11/21/2023)  Utilities: Not At Risk (11/21/2023)  Depression (PHQ2-9): Medium Risk (08/17/2022)  Tobacco Use: Medium Risk (11/21/2023)   SDOH Interventions:     Readmission Risk Interventions      No data to display

## 2023-11-22 NOTE — TOC CM/SW Note (Signed)
    Durable Medical Equipment  (From admission, onward)           Start     Ordered   11/22/23 1025  For home use only DME Walker rolling  Once       Question Answer Comment  Walker: With 5 Inch Wheels   Patient needs a walker to treat with the following condition Weakness      11/22/23 1025   11/22/23 1025  For home use only DME 3 n 1  Once        11/22/23 1025   11/22/23 1025  For home use only DME standard manual wheelchair with seat cushion  Once       Comments: Patient suffers from 1. Intramedullary nailing of right tibia fracture, Nonoperative management of right fibula fracture  which impairs their ability to perform daily activities like  ambulating in the home.  A cane  will not resolve issue with performing activities of daily living. A wheelchair will allow patient to safely perform daily activities. Patient can safely propel the wheelchair in the home or has a caregiver who can provide assistance. Length of need lifetime . Accessories: elevating leg rests (ELRs), wheel locks, extensions and anti-tippers.  Seat and back cushions   11/22/23 1025           Patient confined to a room with no bathroom therefore needs a bedside commode.

## 2023-11-22 NOTE — Plan of Care (Signed)

## 2023-11-22 NOTE — Progress Notes (Addendum)
 Progress Note   Patient: [name] [name]:[id number] DOB: [birth year] DOA: 11/20/2023     0 DOS: the patient was seen and examined on 11/22/2023   Brief hospital course: [name] [name] is a 33 y.o. female with no significant medical history presently [redacted] weeks pregnant had a fall while walking her dog. She had right tib fib fracture admitted to [facility] service, with ortho consult.  Assessment and Plan: S/p mechanical fall - initial encounter Right tib-fib fracture S/p ORIF  Continue pain control with low dose opiates, tylenol . She did receive IV dilaudid yesterday. PT/ OT follow up, rolling walker, 3 in 1, wheel chair orders placed. Continue Lovenox for DVT.  17 week Pregnancy- Appreciate Gyn consult. Fetal monitoring per Gyn. Continue prenatal multivitamins.  Leukocytosis- likely reactive. Trending down. Continue to monitor.      Subjective: Patient is seen and examined today morning. She is sitting in chair. Had severe pain yesterday, had to take dilaudid. She did not eat well yesterday. PT/ OT worked with her, ice pack over knee noted.  Physical Exam: Vitals:   11/21/23 1556 11/21/23 2306 11/22/23 0315 11/22/23 0900  BP: 129/73 111/74 111/70 118/76  Pulse: 80 78 76 90  Resp: 17 18 17 18   Temp: 98.3 F (36.8 C) 98.4 F (36.9 C) 98.2 F (36.8 C) (!) 97.3 F (36.3 C)  TempSrc: Oral Oral Oral   SpO2: 98% 98% 95% 96%  Weight:      Height:       General - Young  Caucasian female, no apparent distress HEENT - PERRLA, EOMI, atraumatic head, non tender sinuses. Lung - Clear, no rales, rhonchi, wheezes. Heart - S1, S2 heard, no murmurs, rubs, no pedal edema. Abdomen - Soft, non tender, bowel sounds good Neuro - Alert, awake and oriented x 3, non focal exam. Skin - Warm and dry. Cam boot right leg noted. Data Reviewed:     Latest Ref Rng & Units 11/22/2023    8:24 AM 11/21/2023   12:24 PM 11/21/2023    7:06 AM  CBC  WBC 4.0 - 10.5 K/uL 14.9  16.9  16.5   Hemoglobin  12.0 - 15.0 g/dL 81.1  91.4  78.2   Hematocrit 36.0 - 46.0 % 34.8  34.9  35.5   Platelets 150 - 400 K/uL 335  324  275        Latest Ref Rng & Units 11/21/2023    7:06 AM 11/20/2023    9:40 PM 07/06/2022    7:44 PM  BMP  Glucose 70 - 99 mg/dL 87  98  90   BUN 6 - 20 mg/dL 8  12  10    Creatinine 0.44 - 1.00 mg/dL 9.56  2.13  0.86   Sodium 135 - 145 mmol/L 138  138  137   Potassium 3.5 - 5.1 mmol/L 3.7  3.7  3.5   Chloride 98 - 111 mmol/L 112  107  108   CO2 22 - 32 mmol/L 17  19  18    Calcium  8.9 - 10.3 mg/dL 8.2  9.1  9.1     DG Tibia/Fibula Right Port Result Date: 11/21/2023 CLINICAL DATA:  Follow up fractures post tibial intramedullary rod fixation. EXAM: PORTABLE RIGHT TIBIA AND FIBULA - 2 VIEW COMPARISON:  Intraoperative views same date. Radiographs 11/20/2023. FINDINGS: Right tibial intramedullary nail is secured by 2 proximal and 2 distal interlocking screws. Near anatomic reduction of the mildly comminuted distal tibial diaphyseal fracture. There is also near anatomic reduction of  the mildly comminuted distal fibular diaphyseal fracture. No widening of the ankle mortise or complications identified. There is mild soft tissue swelling distally. IMPRESSION: Near anatomic reduction of the distal tibial and fibular diaphyseal fractures post tibial intramedullary rod fixation. Electronically Signed   By: Elmon Hagedorn M.D.   On: 11/21/2023 15:08   DG Tibia/Fibula Right Result Date: 11/21/2023 CLINICAL DATA:  ORIF. EXAM: RIGHT TIBIA AND FIBULA - 2 VIEW COMPARISON:  Right lower extremity radiograph dated 11/21/2023. FINDINGS: Six intraoperative fluoroscopic spot images provided. The total fluoroscopic time is 1 minute 23 seconds and cumulative air kerma of 2.8 mGy. Status post ORIF of right tibial fracture with intramedullary nail. IMPRESSION: Intraoperative fluoroscopic spot images. Electronically Signed   By: Angus Bark M.D.   On: 11/21/2023 14:19   DG C-Arm 1-60 Min-No Report Result  Date: 11/21/2023 Fluoroscopy was utilized by the requesting physician.  No radiographic interpretation.   DG Ankle 2 Views Right Result Date: 11/21/2023 EXAM: 2 VIEW(S) XRAY OF THE RIGHT ANKLE 11/20/2023 11:57:37 PM CLINICAL HISTORY: Post-reduction. Post reduction right ankle. Patient shielded/pregnant [redacted] weeks; injured walking dog on a wet, grassy hill when right leg folded under her. Patient stated she heard a crack. Presenting with obvious deformity to right leg above ankle. COMPARISON: Right ankle radiographs earlier today. FINDINGS: BONES: Mildly displaced, comminuted distal tibial and fibular shaft fractures, with approximately half the shaft with medial displacement. Overlying splint obscures fine osseous detail. JOINTS: No joint dislocation. No significant joint effusion. SOFT TISSUES: The soft tissues are unremarkable. IMPRESSION: 1. Mildly displaced, comminuted distal tibial and fibular shaft fractures, as above. Electronically signed by: Zadie Herter MD 11/21/2023 12:05 AM EDT RP Workstation: [id number]   DG Knee 2 Views Right Result Date: 11/21/2023 EXAM: 1 or 2 VIEW(S) XRAY OF THE RIGHT KNEE 11/20/2023 11:57:37 PM COMPARISON: None available. CLINICAL HISTORY: Post-reduction. Post reduction right ankle. Patient shielded/pregnant [redacted] weeks; Injured walking dog on a wet, grassy hill when right leg folded under her. Patient stated she heard a crack. Presenting with obvious deformity to right leg above ankle. FINDINGS: BONES AND JOINTS: No acute fracture. No focal osseous lesion. No joint dislocation. No significant joint effusion. SOFT TISSUES: The soft tissues are unremarkable. IMPRESSION: 1. No acute osseous abnormality. Electronically signed by: Zadie Herter MD 11/21/2023 12:04 AM EDT RP Workstation: [id number]   DG Ankle 2 Views Right Result Date: 11/20/2023 EXAM: 2 VIEW(S) XRAY OF THE RIGHT ANKLE 11/20/2023 10:22:00 PM CLINICAL HISTORY: Pain. Fall, lower right leg pain. COMPARISON: None  available. FINDINGS: BONES: Displaced, comminuted distal tibial shaft fracture with apex medial angulation. Displaced, mildly comminuted distal fibular shaft fracture with apex medial angulation. JOINTS: No joint dislocation. No significant joint effusion. SOFT TISSUES: The soft tissues are unremarkable. IMPRESSION: 1. Displaced, comminuted distal tibial and fibular shaft fractures, as above. Electronically signed by: Zadie Herter MD 11/20/2023 10:29 PM EDT RP Workstation: [id number]    Family Communication: Discussed the care plan with patient, family at bedside. They understand and agree.  Disposition: Status is: Observation The patient remains OBS appropriate and will d/c before 2 midnights.  Planned Discharge Destination: Home with Home Health    Time spent: 39 minutes  Author: Aisha Hove, MD 11/22/2023 2:19 PM  For on call review [email or web address].

## 2023-11-22 NOTE — Evaluation (Addendum)
 Occupational Therapy Evaluation Patient Details Name: [name] [name] MRN: [id number] DOB: 1990/09/05 Today's Date: 11/22/2023   History of Present Illness   33 y.o. female admitted 4/27 with a fractured Rt tibia/fibula after sustaining a fall. S/p Intramedullary nailing of right tibia fracture. She is [redacted] weeks pregnant and currently breastfeeding. PMH: anxiety, migraines.     Clinical Impressions Pt admitted based on above, and was seen based on problem list below. PTA pt was living at home with her spouse and two children and she was independent with ADLs and IADLs. Today pt is requiring set up  to CGA for ADLs. Bed mobility was min assist for RLE and functional transfers are  CGA with use of RW. Anticipate can d/chome with family support, No follow up OT  needs . OT will continue to follow acutely to maximize functional independence.        If plan is discharge home, recommend the following:   A little help with walking and/or transfers;A little help with bathing/dressing/bathroom     Functional Status Assessment   Patient has had a recent decline in their functional status and demonstrates the ability to make significant improvements in function in a reasonable and predictable amount of time.     Equipment Recommendations   BSC/3in1     Recommendations for Other Services         Precautions/Restrictions   Precautions Precautions: Fall Recall of Precautions/Restrictions: Intact Required Braces or Orthoses: Other Brace Splint/Cast - Date Prophylactic Dressing Applied (if applicable): 11/21/23 Other Brace: CAM boot Restrictions Weight Bearing Restrictions Per Provider Order: Yes RLE Weight Bearing Per Provider Order: Weight bearing as tolerated     Mobility Bed Mobility Overal bed mobility: Needs Assistance Bed Mobility: Supine to Sit     Supine to sit: Min assist, HOB elevated     General bed mobility comments: Min assist for RLE     Transfers Overall transfer level: Needs assistance Equipment used: Rolling walker (2 wheels) Transfers: Sit to/from Stand, Bed to chair/wheelchair/BSC Sit to Stand: Contact guard assist, From elevated surface     Step pivot transfers: Contact guard assist     General transfer comment: Increased time cueing for bearing wt through UE      Balance Overall balance assessment: Needs assistance Sitting-balance support: No upper extremity supported, Feet supported Sitting balance-Leahy Scale: Good     Standing balance support: Bilateral upper extremity supported, Reliant on assistive device for balance Standing balance-Leahy Scale: Fair Standing balance comment: Reliant on RW, able to stand at sink unsupported       ADL either performed or assessed with clinical judgement   ADL Overall ADL's : Needs assistance/impaired Eating/Feeding: Set up;Sitting   Grooming: Wash/dry hands;Contact guard assist;Standing Grooming Details (indicate cue type and reason): CGA for balance standing at sink         Upper Body Dressing : Set up;Sitting   Lower Body Dressing: Contact guard assist;Sit to/from stand Lower Body Dressing Details (indicate cue type and reason): Able to bend at waist to reach bil feet Toilet Transfer: Contact guard assist;Ambulation;Regular Toilet;Rolling walker (2 wheels) Toilet Transfer Details (indicate cue type and reason): CGA increased time use of GB Toileting- Clothing Manipulation and Hygiene: Supervision/safety;Sitting/lateral lean Toileting - Clothing Manipulation Details (indicate cue type and reason): Able to wipe while seated     Functional mobility during ADLs: Contact guard assist;Rolling walker (2 wheels) General ADL Comments: Per pt improved mobility, just reporting fatigue     Vision Baseline Vision/History: 0  No visual deficits Patient Visual Report: No change from baseline Vision Assessment?: No apparent visual deficits             Pertinent Vitals/Pain Pain Assessment Pain Assessment: Faces Faces Pain Scale: Hurts even more Pain Location: RLE Pain Descriptors / Indicators: Aching, Constant, Discomfort, Grimacing Pain Intervention(s): Monitored during session, Repositioned     Extremity/Trunk Assessment Upper Extremity Assessment Upper Extremity Assessment: Overall WFL for tasks assessed   Lower Extremity Assessment Lower Extremity Assessment: Defer to PT evaluation   Cervical / Trunk Assessment Cervical / Trunk Assessment: Normal   Communication Communication Communication: No apparent difficulties   Cognition Arousal: Alert Behavior During Therapy: WFL for tasks assessed/performed Cognition: No apparent impairments     Following commands: Intact       Cueing  General Comments   Cueing Techniques: Verbal cues  Pt c/o of lightheadness during inital mobility, subsides with time           Home Living Family/patient expects to be discharged to:: Private residence Living Arrangements: Children;Spouse/significant other Available Help at Discharge: Family;Available 24 hours/day Type of Home: House Home Access: Stairs to enter [employer] of Steps: 3 (+ threshold) Entrance Stairs-Rails: None Home Layout: Two level;Able to live on main level with bedroom/bathroom     Bathroom Shower/Tub: Producer, television/film/video: Handicapped height Bathroom Accessibility: Yes How Accessible: Accessible via walker Home Equipment: Shower seat;Cane - quad;Grab bars - tub/shower;Hand held shower head          Prior Functioning/Environment Prior Level of Function : Independent/Modified Independent       Mobility Comments: ind active ADLs Comments: ind    OT Problem List: Decreased strength;Decreased range of motion;Decreased activity tolerance;Impaired balance (sitting and/or standing);Decreased knowledge of use of DME or AE   OT Treatment/Interventions: Self-care/ADL  training;Therapeutic exercise;DME and/or AE instruction;Energy conservation;Therapeutic activities;Patient/family education;Balance training      OT Goals(Current goals can be found in the care plan section)   Acute Rehab OT Goals Patient Stated Goal: To go home OT Goal Formulation: With patient Time For Goal Achievement: 12/06/23 Potential to Achieve Goals: Good   OT Frequency:  Min 2X/week       AM-PAC OT "6 Clicks" Daily Activity     Outcome Measure Help from another person eating meals?: None Help from another person taking care of personal grooming?: A Little Help from another person toileting, which includes using toliet, bedpan, or urinal?: A Little Help from another person bathing (including washing, rinsing, drying)?: A Little Help from another person to put on and taking off regular upper body clothing?: A Little Help from another person to put on and taking off regular lower body clothing?: A Little 6 Click Score: 19   End of Session Equipment Utilized During Treatment: Gait belt;Rolling walker (2 wheels) Nurse Communication: Mobility status  Activity Tolerance: Patient tolerated treatment well Patient left: in chair;with call bell/phone within reach  OT Visit Diagnosis: Unsteadiness on feet (R26.81);Other abnormalities of gait and mobility (R26.89);Muscle weakness (generalized) (M62.81)                Time: 1914-7829 OT Time Calculation (min): 33 min Charges:  OT General Charges $OT Visit: 1 Visit OT Evaluation $OT Eval Moderate Complexity: 1 Mod OT Treatments $Self Care/Home Management : 8-22 mins  Delmer Ferraris, OT  Acute Rehabilitation Services Office [phone] Secure chat preferred   Mickael Alamo 11/22/2023, 8:36 AM

## 2023-11-23 DIAGNOSIS — Z8249 Family history of ischemic heart disease and other diseases of the circulatory system: Secondary | ICD-10-CM | POA: Diagnosis not present

## 2023-11-23 DIAGNOSIS — Z833 Family history of diabetes mellitus: Secondary | ICD-10-CM | POA: Diagnosis not present

## 2023-11-23 DIAGNOSIS — S82201A Unspecified fracture of shaft of right tibia, initial encounter for closed fracture: Secondary | ICD-10-CM | POA: Diagnosis present

## 2023-11-23 DIAGNOSIS — Y93K1 Activity, walking an animal: Secondary | ICD-10-CM | POA: Diagnosis not present

## 2023-11-23 DIAGNOSIS — W010XXA Fall on same level from slipping, tripping and stumbling without subsequent striking against object, initial encounter: Secondary | ICD-10-CM | POA: Diagnosis present

## 2023-11-23 DIAGNOSIS — S82831A Other fracture of upper and lower end of right fibula, initial encounter for closed fracture: Secondary | ICD-10-CM | POA: Diagnosis present

## 2023-11-23 DIAGNOSIS — T402X5A Adverse effect of other opioids, initial encounter: Secondary | ICD-10-CM | POA: Diagnosis not present

## 2023-11-23 DIAGNOSIS — S82301A Unspecified fracture of lower end of right tibia, initial encounter for closed fracture: Secondary | ICD-10-CM | POA: Diagnosis present

## 2023-11-23 DIAGNOSIS — D508 Other iron deficiency anemias: Secondary | ICD-10-CM | POA: Diagnosis not present

## 2023-11-23 DIAGNOSIS — S82209A Unspecified fracture of shaft of unspecified tibia, initial encounter for closed fracture: Secondary | ICD-10-CM | POA: Diagnosis present

## 2023-11-23 DIAGNOSIS — O9A212 Injury, poisoning and certain other consequences of external causes complicating pregnancy, second trimester: Secondary | ICD-10-CM | POA: Diagnosis present

## 2023-11-23 DIAGNOSIS — Z3A17 17 weeks gestation of pregnancy: Secondary | ICD-10-CM | POA: Diagnosis not present

## 2023-11-23 DIAGNOSIS — O99012 Anemia complicating pregnancy, second trimester: Secondary | ICD-10-CM | POA: Diagnosis present

## 2023-11-23 DIAGNOSIS — Z79899 Other long term (current) drug therapy: Secondary | ICD-10-CM | POA: Diagnosis not present

## 2023-11-23 DIAGNOSIS — O99112 Other diseases of the blood and blood-forming organs and certain disorders involving the immune mechanism complicating pregnancy, second trimester: Secondary | ICD-10-CM | POA: Diagnosis present

## 2023-11-23 DIAGNOSIS — D72829 Elevated white blood cell count, unspecified: Secondary | ICD-10-CM | POA: Diagnosis present

## 2023-11-23 DIAGNOSIS — R110 Nausea: Secondary | ICD-10-CM | POA: Diagnosis not present

## 2023-11-23 DIAGNOSIS — Y92239 Unspecified place in hospital as the place of occurrence of the external cause: Secondary | ICD-10-CM | POA: Diagnosis not present

## 2023-11-23 DIAGNOSIS — Z87891 Personal history of nicotine dependence: Secondary | ICD-10-CM | POA: Diagnosis not present

## 2023-11-23 LAB — CBC
HCT: 31.9 % — ABNORMAL LOW (ref 36.0–46.0)
Hemoglobin: 10.7 g/dL — ABNORMAL LOW (ref 12.0–15.0)
MCH: 31.1 pg (ref 26.0–34.0)
MCHC: 33.5 g/dL (ref 30.0–36.0)
MCV: 92.7 fL (ref 80.0–100.0)
Platelets: 263 10*3/uL (ref 150–400)
RBC: 3.44 MIL/uL — ABNORMAL LOW (ref 3.87–5.11)
RDW: 12.9 % (ref 11.5–15.5)
WBC: 11.9 10*3/uL — ABNORMAL HIGH (ref 4.0–10.5)
nRBC: 0 % (ref 0.0–0.2)

## 2023-11-23 MED ORDER — VITAMIN D3 25 MCG PO TABS: 1000.0000 [IU] | ORAL_TABLET | Freq: Every day | ORAL | 2 refills | Status: AC

## 2023-11-23 MED ORDER — ASPIRIN 81 MG PO TBEC: 162.0000 mg | DELAYED_RELEASE_TABLET | Freq: Every day | ORAL | 12 refills | Status: DC

## 2023-11-23 MED ORDER — OXYCODONE HCL 5 MG PO TABS: 5.0000 mg | ORAL_TABLET | ORAL | 0 refills | Status: DC | PRN

## 2023-11-23 MED ORDER — ACETAMINOPHEN 500 MG PO TABS: 1000.0000 mg | ORAL_TABLET | Freq: Four times a day (QID) | ORAL | Status: DC | PRN

## 2023-11-23 MED ORDER — POLYETHYLENE GLYCOL 3350 17 G PO PACK: 17.0000 g | PACK | Freq: Every day | ORAL | Status: DC | PRN

## 2023-11-23 NOTE — Plan of Care (Signed)

## 2023-11-23 NOTE — Progress Notes (Signed)
 Progress Note   Patient: [name] [name] UJW:[id number] DOB: [birth year] DOA: 11/20/2023     0 DOS: the patient was seen and examined on 11/23/2023   Brief hospital course: [name] [name] is a 33 y.o. female with no significant medical history presently [redacted] weeks pregnant had a fall while walking her dog. She had right tib fib fracture admitted to [facility] service, with ortho consult.  Assessment and Plan: S/p mechanical fall - initial encounter Right tib-fib fracture S/p ORIF  Continue pain control with low dose opiates, tylenol . She did receive IV dilaudid this morning. States that if pain better on orals she wants to be discharged. PT/ OT follow up, rolling walker, 3 in 1, wheel chair orders placed. Continue Lovenox for DVT.  17 week Pregnancy- Appreciate Gyn consult. Fetal monitoring per Gyn. Continue prenatal multivitamins.  Leukocytosis- likely reactive. Resolved.      Subjective: Patient is seen and examined today morning. She had severe pain got dilaudid. Cam boot removed. PT/ OT worked with her. States appetite better. Got DME delivered. Wishes to be discharged if pain better with oral pain meds.  Physical Exam: Vitals:   11/22/23 0900 11/22/23 1623 11/22/23 2207 11/23/23 0835  BP: 118/76 (!) 121/90 128/71 124/66  Pulse: 90 83 80 75  Resp: 18 18 18 18   Temp: (!) 97.3 F (36.3 C) 98.2 F (36.8 C) 98.4 F (36.9 C) 97.8 F (36.6 C)  TempSrc:   Oral Oral  SpO2: 96% 99% 99%   Weight:      Height:       General - Young  Caucasian female, no apparent distress HEENT - PERRLA, EOMI, atraumatic head, non tender sinuses. Lung - Clear, no rales, rhonchi, wheezes. Heart - S1, S2 heard, no murmurs, rubs, no pedal edema. Abdomen - Soft, non tender, bowel sounds good Neuro - Alert, awake and oriented x 3, non focal exam. Skin - Warm and dry. Cam boot right leg noted. Data Reviewed:     Latest Ref Rng & Units 11/23/2023    6:46 AM 11/22/2023    8:24 AM 11/21/2023   12:24 PM   CBC  WBC 4.0 - 10.5 K/uL 11.9  14.9  16.9   Hemoglobin 12.0 - 15.0 g/dL 56.2  13.0  86.5   Hematocrit 36.0 - 46.0 % 31.9  34.8  34.9   Platelets 150 - 400 K/uL 263  335  324        Latest Ref Rng & Units 11/21/2023    7:06 AM 11/20/2023    9:40 PM 07/06/2022    7:44 PM  BMP  Glucose 70 - 99 mg/dL 87  98  90   BUN 6 - 20 mg/dL 8  12  10    Creatinine 0.44 - 1.00 mg/dL 7.84  6.96  2.95   Sodium 135 - 145 mmol/L 138  138  137   Potassium 3.5 - 5.1 mmol/L 3.7  3.7  3.5   Chloride 98 - 111 mmol/L 112  107  108   CO2 22 - 32 mmol/L 17  19  18    Calcium  8.9 - 10.3 mg/dL 8.2  9.1  9.1     No results found.   Family Communication: Discussed the care plan with patient, family at bedside. They understand and agree.  Disposition: Status is: changed to inpatient. As she is on IV pain meds, need pain control post surgery.  Planned Discharge Destination: Home with Home Health, DME    Time spent: 38 minutes  Author: Aisha Hove, MD 11/23/2023 5:53 PM  For on call review [email or web address].

## 2023-11-23 NOTE — Progress Notes (Addendum)
 Orthopaedic Trauma Progress Note  SUBJECTIVE: Doing okay this morning.  Felt slightly nauseous and lightheaded after receiving oxycodone  this morning, but does feel it is helping with the pain.  Notes moderate soreness/pain in the knee currently.  No chest pain. No SOB. No other complaints.  Tolerating diet and fluids.  Patient's husband at bedside.  OBJECTIVE:  Vitals:   11/22/23 2207 11/23/23 0835  BP: 128/71 124/66  Pulse: 80 75  Resp: 18 18  Temp: 98.4 F (36.9 C) 97.8 F (36.6 C)  SpO2: 99%     Opiates Today (MME): Today's  total administered Morphine Milligram Equivalents: 15 Opiates Yesterday (MME): Yesterday's total administered Morphine Milligram Equivalents: 120  General: Sitting up in bed, no acute distress Respiratory: No increased work of breathing.  Operative Extremity (right lower extremity): CAM boot in place.  Dressing removed, incisions are clean, dry, intact with Steri-Strips in place.  Soreness about the knee as expected.  Some soreness to the calf but no areas of significant tenderness.  Tolerates gentle passive ankle range of motion.  Able to wiggle toes.  Endorses sensation over all aspects of the foot.  Toes are well-perfused.  2+ DP pulse  IMAGING: Stable post op imaging.   LABS:  Results for orders placed or performed during the hospital encounter of 11/20/23 (from the past 24 hours)  CBC     Status: Abnormal   Collection Time: 11/23/23  6:46 AM  Result Value Ref Range   WBC 11.9 (H) 4.0 - 10.5 K/uL   RBC 3.44 (L) 3.87 - 5.11 MIL/uL   Hemoglobin 10.7 (L) 12.0 - 15.0 g/dL   HCT 16.1 (L) 09.6 - 04.5 %   MCV 92.7 80.0 - 100.0 fL   MCH 31.1 26.0 - 34.0 pg   MCHC 33.5 30.0 - 36.0 g/dL   RDW 40.9 81.1 - 91.4 %   Platelets 263 150 - 400 K/uL   nRBC 0.0 0.0 - 0.2 %    ASSESSMENT: [name] [name] is a 33 y.o. female, 2 Days Post-Op s/p mechanical fall Procedures: INTRAMEDULLARY NAILING RIGHT TIBIA SHAFT FRACTURE  CV/Blood loss: Hemoglobin 10.7 this  morning.  Stable from preop.  Hemodynamically stable  PLAN: Weightbearing: WBAT RLE in cam boot ROM: Okay for knee and ankle motion as tolerated Incisional and dressing care: Okay to leave incisions open to air Showering: Okay to begin showering getting incisions wet starting 11/24/2023 Orthopedic device(s): CAM boot when OOB and mobilizing Pain management:  1. Tylenol  [phone] mg q 6 hours scheduled 2. Flexeril 5 mg 3 times daily PRN 3. Oxycodone  5-10 mg q 4 hours PRN 4. Dilaudid 0.5-1 mg q 3 hours PRN VTE prophylaxis: Aspirin 162 mg, SCDs ID:  Ancef 2gm post op completed Foley/Lines:  No foley, KVO IVFs Impediments to Fracture Healing: Vitamin D level 27, started on daily supplementation Dispo: PT/OT evaluation ongoing.  TOC following for DME needs.  Okay for discharge from ortho standpoint once cleared by medicine team and therapies  D/C recommendations: - Tylenol , oxycodone  as needed for pain control - Aspirin 162 mg daily x 30 days for DVT prophylaxis - Continue 1000 units Vit D supplementation daily x 90 days  Follow - up plan: 2 weeks after d/c for wound check and repeat x-rays   Contact information:  Katheryne Pane MD, Alona Jamaica PA-C. After hours and holidays please check [email or web address] for group call information for [facility]   Edilia Gordon, PA-C [phone] (office) [email or web address]

## 2023-11-23 NOTE — Plan of Care (Signed)
  Problem: Pain Managment: Goal: General experience of comfort will improve and/or be controlled Outcome: Progressing   Problem: Safety: Goal: Ability to remain free from injury will improve Outcome: Progressing

## 2023-11-23 NOTE — Progress Notes (Signed)
 Discharge instructions reviewed with pt and her husband. PIV sites removed from each hand, caths intact and sites unremarkable.  Copy of instructions given to pt. Pt informed her scripts were sent to her pharmacy for pick up. Pt has her DME already per nurse. Pt will be getting dressed shortly, husband has gone out to the car to get her clothes and shoe for left foot.  Pt will be d/c'd via wheelchair with belongings, with her husband and children and will be escorted by staff.   Jaedah Lords,RN SWOT

## 2023-11-23 NOTE — TOC Progression Note (Signed)
 Transition of Care (TOC) - Progression Note   Followed up with patient and husband at bedside regarding DME.   [employer] delivered rolling walker for home yesterday ( at bedside). Husband got patient a wheelchair elsewhere. At this time they do not want 3in 1 /bedside commode.   They have a shower chair at home and raised commode seat at home.    Patient Details  Name: [name] [name] MRN: [id number] Date of Birth: 02/10/1991  Transition of Care [facility]) CM/SW Contact  Jamaine Quintin, Arturo Late, RN Phone Number: 11/23/2023, 10:57 AM  Clinical Narrative:       Expected Discharge Plan: Home/Self Care Barriers to Discharge: Continued Medical Work up  Expected Discharge Plan and Services   Discharge Planning Services: CM Consult Post Acute Care Choice: Durable Medical Equipment Living arrangements for the past 2 months: Single Family Home                 DME Arranged: Walker rolling, Wheelchair manual, 3-N-1   Date DME Agency Contacted: 11/22/23 Time DME Agency Contacted: 1014 Representative spoke with at DME Agency: Raechel Bulla [facility] Arranged: NA           Social Determinants of Health (SDOH) Interventions SDOH Screenings   Food Insecurity: No Food Insecurity (11/21/2023)  Housing: Low Risk  (11/21/2023)  Transportation Needs: No Transportation Needs (11/21/2023)  Utilities: Not At Risk (11/21/2023)  Depression (PHQ2-9): Medium Risk (08/17/2022)  Tobacco Use: Medium Risk (11/21/2023)    Readmission Risk Interventions     No data to display

## 2023-11-23 NOTE — Progress Notes (Signed)
 Physical Therapy Treatment Patient Details Name: [name] [name] MRN: [id number] DOB: 1991-02-12 Today's Date: 11/23/2023   History of Present Illness 33 y.o. female admitted 4/27 with a fractured Rt tibia/fibula after sustaining a fall. S/p Intramedullary nailing of right tibia fracture. She is [redacted] weeks pregnant and currently breastfeeding. PMH: anxiety, migraines.    PT Comments  Pt reports symptoms of feeling shaky and a hot flash when mobilizing during session today, BP 108/63. Pt is able to still tolerate a short bout of ambulation and negotiates one step with cues from PT for technique. Pt will benefit from frequent mobilization to improve activity tolerance. PT provides further education on HEP to improve knee ROM and strength. PT recommends discharge home when medically appropriate.    If plan is discharge home, recommend the following: A little help with walking and/or transfers;A little help with bathing/dressing/bathroom;Assistance with cooking/housework;Assist for transportation;Help with stairs or ramp for entrance   Can travel by private vehicle        Equipment Recommendations  Rolling walker (2 wheels);Wheelchair (measurements PT);Wheelchair cushion (measurements PT) (pt may elect to purchase a transport chair on her own as opposed to a wheelchair)    Recommendations for Other Services       Precautions / Restrictions Precautions Precautions: Fall Recall of Precautions/Restrictions: Intact Required Braces or Orthoses: Other Brace Splint/Cast - Date Prophylactic Dressing Applied (if applicable): 11/21/23 Other Brace: CAM boot Restrictions Weight Bearing Restrictions Per Provider Order: Yes RLE Weight Bearing Per Provider Order: Weight bearing as tolerated Other Position/Activity Restrictions: WBAT in CAM boot     Mobility  Bed Mobility Overal bed mobility: Needs Assistance Bed Mobility: Supine to Sit     Supine to sit: Min assist, HOB elevated     General  bed mobility comments: pt's spouse assisting with RLE, PT also provided leg lifter for pt to utilize    Transfers Overall transfer level: Needs assistance Equipment used: Rolling walker (2 wheels) Transfers: Sit to/from Stand Sit to Stand: Contact guard assist                Ambulation/Gait Ambulation/Gait assistance: Contact guard assist Gait Distance (Feet): 10 Feet Assistive device: Rolling walker (2 wheels) Gait Pattern/deviations: Step-to pattern Gait velocity: reduced Gait velocity interpretation: <1.31 ft/sec, indicative of household ambulator   General Gait Details: slowed step-to gait, reduced stance time on RLE   Stairs Stairs: Yes Stairs assistance: Contact guard assist Stair Management: With walker, Backwards Number of Stairs: 1 General stair comments: pt hops backward up one step   Wheelchair Mobility     Tilt Bed    Modified Rankin (Stroke Patients Only)       Balance Overall balance assessment: Needs assistance Sitting-balance support: No upper extremity supported, Feet supported Sitting balance-Leahy Scale: Good     Standing balance support: Single extremity supported, Reliant on assistive device for balance Standing balance-Leahy Scale: Poor                              Communication Communication Communication: No apparent difficulties  Cognition Arousal: Alert Behavior During Therapy: WFL for tasks assessed/performed   PT - Cognitive impairments: No apparent impairments                         Following commands: Intact      Cueing Cueing Techniques: Verbal cues  Exercises Other Exercises Other Exercises: PT provides education on seated knee  flexion utilizing a towel to reduce friction. Pt can also utilize the gait belt provided as a leg lifter to aide in improving knee flexion Other Exercises: Instruction provided on standing weight shift exercise and marching in an effort to improve tolerance for  weightbearing through RLE    General Comments General comments (skin integrity, edema, etc.): VSS on RA. BP 108/63. Pt does report a hot flash and feeling shaky in standing which she says is new for her, possibly related to recent pain medication      Pertinent Vitals/Pain Pain Assessment Pain Assessment: Faces Faces Pain Scale: Hurts even more Pain Location: RLE Pain Descriptors / Indicators: Grimacing Pain Intervention(s): Monitored during session    Home Living                          Prior Function            PT Goals (current goals can now be found in the care plan section) Acute Rehab PT Goals Patient Stated Goal: Get well, reduce pain Progress towards PT goals: Progressing toward goals    Frequency    Min 3X/week      PT Plan      Co-evaluation              AM-PAC PT "6 Clicks" Mobility   Outcome Measure  Help needed turning from your back to your side while in a flat bed without using bedrails?: A Little Help needed moving from lying on your back to sitting on the side of a flat bed without using bedrails?: A Little Help needed moving to and from a bed to a chair (including a wheelchair)?: A Little Help needed standing up from a chair using your arms (e.g., wheelchair or bedside chair)?: A Little Help needed to walk in hospital room?: A Little Help needed climbing 3-5 steps with a railing? : A Lot 6 Click Score: 17    End of Session Equipment Utilized During Treatment: Gait belt Activity Tolerance: Patient tolerated treatment well     PT Visit Diagnosis: Unsteadiness on feet (R26.81);Other abnormalities of gait and mobility (R26.89);History of falling (Z91.81);Pain Pain - Right/Left: Right Pain - part of body: Leg     Time: 0921-0959 PT Time Calculation (min) (ACUTE ONLY): 38 min  Charges:    $Gait Training: 8-22 mins $Therapeutic Exercise: 8-22 mins $Therapeutic Activity: 8-22 mins PT General Charges $$ ACUTE PT VISIT: 1  Visit                     Rexie Catena, PT, DPT Acute Rehabilitation Office [phone]    Rexie Catena 11/23/2023, 10:45 AM

## 2023-11-23 NOTE — Discharge Summary (Signed)
 Physician Discharge Summary   Patient: [name] [name] MRN: [id number] DOB: 1990-09-20  Admit date:     11/20/2023  Discharge date: 11/23/23  Discharge Physician: Aisha Hove   PCP: Odette Benjamin, NP   Recommendations at discharge:    PCP follow up in 1 week. Ortho follow up as scheduled. Gyn follow up as instructed.  Discharge Diagnoses: Principal Problem:   Tibia/fibula fracture Active Problems:   Anemia   Tibial fracture   Leucocytosis   [redacted] weeks gestation of pregnancy   Tibia/fibula fracture, right, closed, initial encounter  Resolved Problems:   * No resolved hospital problems. *  Hospital Course: [name] [name] is a 33 y.o. female with no significant medical history presently [redacted] weeks pregnant had a fall while walking her dog. She had right tib fib fracture admitted to [facility] service, with ortho consult.   Assessment and Plan: S/p mechanical fall - initial encounter Right tib-fib fracture S/p intramedullary nailing right tibia shaft fracture, Continue pain control with low dose opiates, tylenol . States pain better on oral pain med and she wishes to go home. PT/ OT follow up, rolling walker, wheel chair DME arranged. She refused [facility] for now. Aspirin  for DVT per orthopedics.  Outpatient ortho follow up as scheduled.   17 week Pregnancy- Continue prenatal multivitamins. Continue iron  supplements. Outpatient Ob/gyn follow up as scheduled.   Leukocytosis- likely reactive. Resolved.      Consultants: ortho Procedures performed: intramedullary nailing right tibia shaft fracture  Disposition: Home Diet recommendation:  Discharge Diet Orders (From admission, onward)     Start     Ordered   11/23/23 0000  Diet - low sodium heart healthy        11/23/23 1806           Cardiac diet DISCHARGE MEDICATION: Allergies as of 11/23/2023   No Known Allergies      Medication List     TAKE these medications    acetaminophen  500 MG  tablet Commonly known as: TYLENOL  Take 2 tablets (1,000 mg total) by mouth every 6 (six) hours as needed for mild pain (pain score 1-3), fever or headache.   aspirin  EC 81 MG tablet Take 2 tablets (162 mg total) by mouth daily. Swallow whole. Start taking on: Nov 24, 2023   ferrous sulfate 325 (65 FE) MG tablet Take 325 mg by mouth daily with breakfast.   oxyCODONE  5 MG immediate release tablet Commonly known as: Oxy IR/ROXICODONE  Take 1-2 tablets (5-10 mg total) by mouth every 4 (four) hours as needed for moderate pain (pain score 4-6) or severe pain (pain score 7-10).   polyethylene glycol 17 g packet Commonly known as: MIRALAX  / GLYCOLAX  Take 17 g by mouth daily as needed for mild constipation.   PRENATAL GUMMIES PO Take 2 tablets by mouth daily.   vitamin D3 25 MCG tablet Commonly known as: CHOLECALCIFEROL  Take 1 tablet (1,000 Units total) by mouth daily. Start taking on: Nov 24, 2023               Durable Medical Equipment  (From admission, onward)           Start     Ordered   11/22/23 1025  For home use only DME Walker rolling  Once       Question Answer Comment  Walker: With 5 Inch Wheels   Patient needs a walker to treat with the following condition Weakness      11/22/23 1025  11/22/23 1025  For home use only DME 3 n 1  Once        11/22/23 1025   11/22/23 1025  For home use only DME standard manual wheelchair with seat cushion  Once       Comments: Patient suffers from 1. Intramedullary nailing of right tibia fracture, Nonoperative management of right fibula fracture  which impairs their ability to perform daily activities like  ambulating in the home.  A cane  will not resolve issue with performing activities of daily living. A wheelchair will allow patient to safely perform daily activities. Patient can safely propel the wheelchair in the home or has a caregiver who can provide assistance. Length of need lifetime . Accessories: elevating leg rests  (ELRs), wheel locks, extensions and anti-tippers.  Seat and back cushions   11/22/23 1025            Follow-up Information     Haddix, Florentina Huntsman, MD. Schedule an appointment as [name] as possible for a visit in 2 week(s).   Specialty: Orthopedic Surgery Why: for wound check and repeat x-rays Contact information: [address] [place] [address] [phone]         Odette Benjamin, NP Follow up in 1 week(s).   Specialty: Internal Medicine Contact information: [address] [place] [address] [phone]                Discharge Exam: [facility] Weights   11/20/23 2132 11/21/23 0836  Weight: 93 kg (P) 93 kg      11/23/2023    8:35 AM 11/22/2023   10:07 PM 11/22/2023    4:23 PM  Vitals with BMI  Systolic 124 128 962  Diastolic 66 71 90  Pulse 75 80 83    General - Young  Caucasian pregnant female, no apparent distress HEENT - PERRLA, EOMI, atraumatic head, non tender sinuses. Lung - Clear, no rales, rhonchi, wheezes. Heart - S1, S2 heard, no murmurs, rubs, no pedal edema. Abdomen - Soft, non tender, bowel sounds good Neuro - Alert, awake and oriented x 3, non focal exam. Skin - Warm and dry. Left knee sutures with dressing, cam boot removed.  Condition at discharge: stable  The results of significant diagnostics from this hospitalization (including imaging, microbiology, ancillary and laboratory) are listed below for reference.   Imaging Studies: DG Tibia/Fibula Right Port Result Date: 11/21/2023 CLINICAL DATA:  Follow up fractures post tibial intramedullary rod fixation. EXAM: PORTABLE RIGHT TIBIA AND FIBULA - 2 VIEW COMPARISON:  Intraoperative views same date. Radiographs 11/20/2023. FINDINGS: Right tibial intramedullary nail is secured by 2 proximal and 2 distal interlocking screws. Near anatomic reduction of the mildly comminuted distal tibial diaphyseal fracture. There is also near anatomic reduction of the mildly comminuted distal  fibular diaphyseal fracture. No widening of the ankle mortise or complications identified. There is mild soft tissue swelling distally. IMPRESSION: Near anatomic reduction of the distal tibial and fibular diaphyseal fractures post tibial intramedullary rod fixation. Electronically Signed   By: Elmon Hagedorn M.D.   On: 11/21/2023 15:08   DG Tibia/Fibula Right Result Date: 11/21/2023 CLINICAL DATA:  ORIF. EXAM: RIGHT TIBIA AND FIBULA - 2 VIEW COMPARISON:  Right lower extremity radiograph dated 11/21/2023. FINDINGS: Six intraoperative fluoroscopic spot images provided. The total fluoroscopic time is 1 minute 23 seconds and cumulative air kerma of 2.8 mGy. Status post ORIF of right tibial fracture with intramedullary nail. IMPRESSION: Intraoperative fluoroscopic spot images. Electronically Signed   By:  Angus Bark M.D.   On: 11/21/2023 14:19   DG C-Arm 1-60 Min-No Report Result Date: 11/21/2023 Fluoroscopy was utilized by the requesting physician.  No radiographic interpretation.   DG Ankle 2 Views Right Result Date: 11/21/2023 EXAM: 2 VIEW(S) XRAY OF THE RIGHT ANKLE 11/20/2023 11:57:37 PM CLINICAL HISTORY: Post-reduction. Post reduction right ankle. Patient shielded/pregnant [redacted] weeks; injured walking dog on a wet, grassy hill when right leg folded under her. Patient stated she heard a crack. Presenting with obvious deformity to right leg above ankle. COMPARISON: Right ankle radiographs earlier today. FINDINGS: BONES: Mildly displaced, comminuted distal tibial and fibular shaft fractures, with approximately half the shaft with medial displacement. Overlying splint obscures fine osseous detail. JOINTS: No joint dislocation. No significant joint effusion. SOFT TISSUES: The soft tissues are unremarkable. IMPRESSION: 1. Mildly displaced, comminuted distal tibial and fibular shaft fractures, as above. Electronically signed by: Zadie Herter MD 11/21/2023 12:05 AM EDT RP Workstation: [id number]   DG Knee  2 Views Right Result Date: 11/21/2023 EXAM: 1 or 2 VIEW(S) XRAY OF THE RIGHT KNEE 11/20/2023 11:57:37 PM COMPARISON: None available. CLINICAL HISTORY: Post-reduction. Post reduction right ankle. Patient shielded/pregnant [redacted] weeks; Injured walking dog on a wet, grassy hill when right leg folded under her. Patient stated she heard a crack. Presenting with obvious deformity to right leg above ankle. FINDINGS: BONES AND JOINTS: No acute fracture. No focal osseous lesion. No joint dislocation. No significant joint effusion. SOFT TISSUES: The soft tissues are unremarkable. IMPRESSION: 1. No acute osseous abnormality. Electronically signed by: Zadie Herter MD 11/21/2023 12:04 AM EDT RP Workstation: [id number]   DG Ankle 2 Views Right Result Date: 11/20/2023 EXAM: 2 VIEW(S) XRAY OF THE RIGHT ANKLE 11/20/2023 10:22:00 PM CLINICAL HISTORY: Pain. Fall, lower right leg pain. COMPARISON: None available. FINDINGS: BONES: Displaced, comminuted distal tibial shaft fracture with apex medial angulation. Displaced, mildly comminuted distal fibular shaft fracture with apex medial angulation. JOINTS: No joint dislocation. No significant joint effusion. SOFT TISSUES: The soft tissues are unremarkable. IMPRESSION: 1. Displaced, comminuted distal tibial and fibular shaft fractures, as above. Electronically signed by: Zadie Herter MD 11/20/2023 10:29 PM EDT RP Workstation: [id number]    Microbiology: Results for orders placed or performed during the hospital encounter of 11/20/23  Surgical PCR screen     Status: None   Collection Time: 11/21/23  4:13 AM   Specimen: Nasal Mucosa; Nasal Swab  Result Value Ref Range Status   MRSA, PCR NEGATIVE NEGATIVE Final   Staphylococcus aureus NEGATIVE NEGATIVE Final    Comment: (NOTE) The Xpert SA Assay (FDA approved for NASAL specimens in patients 72 years of age and older), is one component of a comprehensive surveillance program. It is not intended to diagnose infection nor  to guide or monitor treatment. Performed at [facility], [address] [place], [place] [address]     Labs: CBC: Recent Labs  Lab 11/20/23 2140 11/21/23 0706 11/21/23 1224 11/22/23 0824 11/23/23 0646  WBC 16.1* 16.5* 16.9* 14.9* 11.9*  NEUTROABS 11.9*  --   --   --   --   HGB 11.5* 11.6* 11.5* 11.7* 10.7*  HCT 35.4* 35.5* 34.9* 34.8* 31.9*  MCV 95.4 93.7 92.6 92.8 92.7  PLT 310 275 324 335 263   Basic Metabolic Panel: Recent Labs  Lab 11/20/23 2140 11/21/23 0706  NA 138 138  K 3.7 3.7  CL 107 112*  CO2 19* 17*  GLUCOSE 98 87  BUN 12 8  CREATININE 0.63 0.52  CALCIUM   9.1 8.2*   Liver Function Tests: No results for input(s): "AST", "ALT", "ALKPHOS", "BILITOT", "PROT", "ALBUMIN" in the last 168 hours. CBG: No results for input(s): "GLUCAP" in the last 168 hours.  Discharge time spent: 35 minutes.  Signed: Aisha Hove, MD [facility] 11/23/2023

## 2023-11-24 ENCOUNTER — Telehealth: Payer: Self-pay

## 2023-11-24 NOTE — Transitions of Care (Post Inpatient/ED Visit) (Signed)
   11/24/2023  Name: [name] [name] MRN: [id number] DOB: [birth year]  Today's TOC FU Call Status: Today's TOC FU Call Status:: Successful TOC FU Call Completed TOC FU Call Complete Date: 11/24/23 Patient's Name and Date of Birth confirmed.  Transition Care Management Follow-up Telephone Call Date of Discharge: 11/23/23 Discharge Facility: [facility]) Type of Discharge: Inpatient Admission Primary Inpatient Discharge Diagnosis:: fracture right fibula How have you been since you were released from the hospital?: Better Any questions or concerns?: No  Items Reviewed: Did you receive and understand the discharge instructions provided?: Yes Medications obtained,verified, and reconciled?: Yes (Medications Reviewed) Any new allergies since your discharge?: No Dietary orders reviewed?: Yes Do you have support at home?: Yes People in Home [RPT]: spouse  Medications Reviewed Today: Medications Reviewed Today     Reviewed by Darrall Ellison, LPN (Licensed Practical Nurse) on 11/24/23 at 0845  Med List Status: <None>   Medication Order Taking? Sig Documenting Provider Last Dose Status Informant  acetaminophen  (TYLENOL ) 500 MG tablet [id number]  Take 2 tablets (1,000 mg total) by mouth every 6 (six) hours as needed for mild pain (pain score 1-3), fever or headache. Mario Sicard  Active   aspirin  EC 81 MG tablet [id number]  Take 2 tablets (162 mg total) by mouth daily. Swallow whole. Versie Gores, PA-C  Active   cholecalciferol  (CHOLECALCIFEROL ) 25 MCG tablet [id number]  Take 1 tablet (1,000 Units total) by mouth daily. Versie Gores, PA-C  Active   ferrous sulfate 325 (65 FE) MG tablet [id number] No Take 325 mg by mouth daily with breakfast. [provider] 11/20/2023 Morning Active Self  oxyCODONE  (OXY IR/ROXICODONE ) 5 MG immediate release tablet [id number]  Take 1-2 tablets (5-10 mg total) by mouth every 4 (four) hours as needed for moderate pain (pain score 4-6) or  severe pain (pain score 7-10). Versie Gores, PA-C  Active   polyethylene glycol (MIRALAX  / GLYCOLAX ) 17 g packet [id number]  Take 17 g by mouth daily as needed for mild constipation. Versie Gores, PA-C  Active   Prenatal MV & Min w/FA-DHA (PRENATAL GUMMIES PO) [id number] No Take 2 tablets by mouth daily. [provider] 11/20/2023 Morning Active Self            Home Care and Equipment/Supplies: Were Home Health Services Ordered?: NA Any new equipment or medical supplies ordered?: NA  Functional Questionnaire: Do you need assistance with bathing/showering or dressing?: Yes Do you need assistance with meal preparation?: Yes Do you need assistance with eating?: No Do you have difficulty maintaining continence: No Do you need assistance with getting out of bed/getting out of a chair/moving?: No Do you have difficulty managing or taking your medications?: No  Follow up appointments reviewed: PCP Follow-up appointment confirmed?: Yes Date of PCP follow-up appointment?: 11/25/23 Follow-up Provider: [facility] Follow-up appointment confirmed?: No Reason Specialist Follow-Up Not Confirmed: Patient has Specialist Provider Number and will Call for Appointment Do you need transportation to your follow-up appointment?: No Do you understand care options if your condition(s) worsen?: Yes-patient verbalized understanding    SIGNATURE Darrall Ellison, LPN [facility] Nurse Health Advisor Direct Dial [phone]

## 2023-11-25 ENCOUNTER — Encounter: Payer: Self-pay | Admitting: Nurse Practitioner

## 2023-11-25 ENCOUNTER — Telehealth: Admitting: Nurse Practitioner

## 2023-11-25 VITALS — Ht 63.0 in | Wt 180.0 lb

## 2023-11-25 DIAGNOSIS — S82401D Unspecified fracture of shaft of right fibula, subsequent encounter for closed fracture with routine healing: Secondary | ICD-10-CM | POA: Diagnosis not present

## 2023-11-25 DIAGNOSIS — S82201D Unspecified fracture of shaft of right tibia, subsequent encounter for closed fracture with routine healing: Secondary | ICD-10-CM | POA: Diagnosis not present

## 2023-11-25 DIAGNOSIS — Z3A17 17 weeks gestation of pregnancy: Secondary | ICD-10-CM

## 2023-11-25 NOTE — Patient Instructions (Signed)
 It was great to see you!  Call ortho to schedule a follow-up appointment.   Let's follow-up with any concerns  Take care,  Rheba Cedar, NP

## 2023-11-25 NOTE — Assessment & Plan Note (Signed)
 Continue routine visits with OB/GYN and taking prenatal vitamin daily.

## 2023-11-25 NOTE — Progress Notes (Signed)
 [facility]IMARY [facility] [address] [name] [address] Dept: [phone] Dept Fax: [phone]  Virtual Video Visit  I connected with [name] [name] on 11/25/23 at  9:20 AM EDT by a video enabled telemedicine application and verified that I am speaking with the correct person using two identifiers.  Location patient: Home Location provider: Clinic Persons participating in the virtual visit: Patient; [name] Cedar, NP; Rockford Churches, CMA  I discussed the limitations of evaluation and management by telemedicine and the availability of in person appointments. The patient expressed understanding and agreed to proceed.  Chief Complaint  Patient presents with   Hospitalization Follow-up    Follow up, fracture to right fibula    SUBJECTIVE:  HPI: [name] [name] is a 33 y.o. female who presents with hospital follow-up.   Beila was admitted to [facility] on 11/20/23 after a fall while walking her dogs and breaking her right tibia/fibula. She had right IM nail placed. She states that she is doing well, wearing a boot when out of bed and taking oxycodone  5mg  as needed for severe pain. She is using ice and doing stretches she was shown by PT. She will follow-up with OBGYN and ortho in 2 weeks.  Transition of Care Hospital Follow up.   Hospital/Facility: [facility] D/[facility] Physician: Aisha Hove, MD D/C Date: 11/23/23  Records Requested: 11/25/23 Records Received: 11/25/23 Records Reviewed: 11/25/23  Diagnoses on Discharge: Tibia/fibula fracture, anemia, [redacted] weeks gestation of pregnancy  Date of interactive Contact within 48 hours of discharge: 11/24/23 Contact was through: phone  Date of 7 day or 14 day face-to-face visit:    within 7 days  Outpatient Encounter Medications as of 11/25/2023  Medication Sig   acetaminophen  (TYLENOL ) 500 MG tablet Take 2 tablets (1,000 mg total) by mouth every 6 (six) hours as  needed for mild pain (pain score 1-3), fever or headache.   aspirin  EC 81 MG tablet Take 2 tablets (162 mg total) by mouth daily. Swallow whole.   cholecalciferol  (CHOLECALCIFEROL ) 25 MCG tablet Take 1 tablet (1,000 Units total) by mouth daily.   ferrous sulfate 325 (65 FE) MG tablet Take 325 mg by mouth daily with breakfast.   oxyCODONE  (OXY IR/ROXICODONE ) 5 MG immediate release tablet Take 1-2 tablets (5-10 mg total) by mouth every 4 (four) hours as needed for moderate pain (pain score 4-6) or severe pain (pain score 7-10).   Prenatal MV & Min w/FA-DHA (PRENATAL GUMMIES PO) Take 2 tablets by mouth daily.   polyethylene glycol (MIRALAX  / GLYCOLAX ) 17 g packet Take 17 g by mouth daily as needed for mild constipation. (Patient not taking: Reported on 11/25/2023)   No facility-administered encounter medications on file as of 11/25/2023.    Diagnostic Tests Reviewed/Disposition: Reviewed on chart  Consults: ortho, ob/gyn  Discharge Instructions: -Start aspirin  162 mg daily -Follow-up with ortho in 2 weeks  Disease/illness Education: Discussed with patient   Home Health/Community Services Discussions/Referrals: N/A  Establishment or re-establishment of referral orders for community resources: N/A  Discussion with other health care providers: Reviewed notes  Assessment and Support of treatment regimen adherence: Discussed with patient  Appointments Coordinated with: She will call today to schedule ortho follow-up  Education for self-management, independent living, and ADLs: Discussed with patient    Patient Active Problem List   Diagnosis Date Noted   Tibia/fibula fracture, right, closed, initial encounter 11/23/2023   Tibia/fibula fracture 11/21/2023   Anemia 11/21/2023   Tibial fracture  11/21/2023   Leucocytosis 11/21/2023   [redacted] weeks gestation of pregnancy 11/21/2023   Anxiety and depression 07/15/2022   Palpitations 07/15/2022   Obesity (BMI 30-39.9) 07/15/2022    Past  Surgical History:  Procedure Laterality Date   TIBIA IM NAIL INSERTION Right 11/21/2023   Procedure: INSERTION, INTRAMEDULLARY ROD, TIBIA;  Surgeon: Laneta Pintos, MD;  Location: [facility] OR;  Service: Orthopedics;  Laterality: Right;   TONSILLECTOMY      Family History  Problem Relation Age of Onset   Polycystic ovary syndrome Mother    Endometriosis Mother    Sleep apnea Mother    Cancer Maternal Grandmother        uterine   Hypertension Paternal Grandmother    Diabetes Paternal Grandmother    Thyroid  disease Paternal Grandmother     Social History   Tobacco Use   Smoking status: Former    Types: E-cigarettes, Cigarettes    Passive exposure: Never   Smokeless tobacco: Never  Vaping Use   Vaping status: Former  Substance Use Topics   Alcohol use: Yes    Comment: occ   Drug use: Yes    Types: Marijuana     Current Outpatient Medications:    acetaminophen  (TYLENOL ) 500 MG tablet, Take 2 tablets (1,000 mg total) by mouth every 6 (six) hours as needed for mild pain (pain score 1-3), fever or headache., Disp: , Rfl:    aspirin  EC 81 MG tablet, Take 2 tablets (162 mg total) by mouth daily. Swallow whole., Disp: 30 tablet, Rfl: 12   cholecalciferol  (CHOLECALCIFEROL ) 25 MCG tablet, Take 1 tablet (1,000 Units total) by mouth daily., Disp: 30 tablet, Rfl: 2   ferrous sulfate 325 (65 FE) MG tablet, Take 325 mg by mouth daily with breakfast., Disp: , Rfl:    oxyCODONE  (OXY IR/ROXICODONE ) 5 MG immediate release tablet, Take 1-2 tablets (5-10 mg total) by mouth every 4 (four) hours as needed for moderate pain (pain score 4-6) or severe pain (pain score 7-10)., Disp: 42 tablet, Rfl: 0   Prenatal MV & Min w/FA-DHA (PRENATAL GUMMIES PO), Take 2 tablets by mouth daily., Disp: , Rfl:    polyethylene glycol (MIRALAX  / GLYCOLAX ) 17 g packet, Take 17 g by mouth daily as needed for mild constipation. (Patient not taking: Reported on 11/25/2023), Disp: , Rfl:   No Known Allergies  ROS: See pertinent  positives and negatives per HPI.  OBSERVATIONS/OBJECTIVE:  VITALS per patient if applicable: Today's Vitals   11/25/23 0911  Weight: 180 lb (81.6 kg)  Height: 5\' 3"  (1.6 m)  PainSc: 5   PainLoc: Leg   Body mass index is 31.89 kg/m.    GENERAL: Alert and oriented. Appears well and in no acute distress.  HEENT: Atraumatic. Conjunctiva clear. No obvious abnormalities on inspection of external nose and ears.  NECK: Normal movements of the head and neck.  LUNGS: On inspection, no signs of respiratory distress. Breathing rate appears normal. No obvious gross SOB, gasping or wheezing, and no conversational dyspnea.  CV: No obvious cyanosis.  MS: Moves all visible extremities without noticeable abnormality.  PSYCH/NEURO: Pleasant and cooperative. No obvious depression or anxiety. Speech and thought processing grossly intact.  ASSESSMENT AND PLAN:  Problem List Items Addressed This Visit       Musculoskeletal and Integument   Tibia/fibula fracture - Primary   She had a fall and is s/p IM nailing on 11/21/23. She can continue oxycodone  5mg  as prescribed by ortho as needed for severe pain. We  discussed potential for dependence as she is concerned with her prior use of THC and smoking. She can continue to use ice as needed for pain and swelling. Keep follow-up appointment with ortho. Medication reconciliation completed. Follow-up with any concerns.         Other   [redacted] weeks gestation of pregnancy   Continue routine visits with OB/GYN and taking prenatal vitamin daily.         I discussed the assessment and treatment plan with the patient. The patient was provided an opportunity to ask questions and all were answered. The patient agreed with the plan and demonstrated an understanding of the instructions.   The patient was advised to call back or seek an in-person evaluation if the symptoms worsen or if the condition fails to improve as anticipated.   Odette Benjamin, NP

## 2023-11-25 NOTE — Assessment & Plan Note (Signed)
 She had a fall and is s/p IM nailing on 11/21/23. She can continue oxycodone  5mg  as prescribed by ortho as needed for severe pain. We discussed potential for dependence as she is concerned with her prior use of THC and smoking. She can continue to use ice as needed for pain and swelling. Keep follow-up appointment with ortho. Medication reconciliation completed. Follow-up with any concerns.

## 2023-12-06 DIAGNOSIS — S82401D Unspecified fracture of shaft of right fibula, subsequent encounter for closed fracture with routine healing: Secondary | ICD-10-CM | POA: Diagnosis not present

## 2023-12-06 DIAGNOSIS — S82201D Unspecified fracture of shaft of right tibia, subsequent encounter for closed fracture with routine healing: Secondary | ICD-10-CM | POA: Diagnosis not present

## 2023-12-08 DIAGNOSIS — Z363 Encounter for antenatal screening for malformations: Secondary | ICD-10-CM | POA: Diagnosis not present

## 2023-12-08 DIAGNOSIS — Z3A20 20 weeks gestation of pregnancy: Secondary | ICD-10-CM | POA: Diagnosis not present

## 2023-12-19 ENCOUNTER — Emergency Department (HOSPITAL_BASED_OUTPATIENT_CLINIC_OR_DEPARTMENT_OTHER): Admission: EM | Admit: 2023-12-19 | Discharge: 2023-12-19 | Disposition: A | Discharge status: Home / Self Care

## 2023-12-19 ENCOUNTER — Other Ambulatory Visit: Payer: Self-pay

## 2023-12-19 DIAGNOSIS — R600 Localized edema: Secondary | ICD-10-CM | POA: Diagnosis not present

## 2023-12-19 DIAGNOSIS — Z3A22 22 weeks gestation of pregnancy: Secondary | ICD-10-CM | POA: Diagnosis not present

## 2023-12-19 DIAGNOSIS — L239 Allergic contact dermatitis, unspecified cause: Secondary | ICD-10-CM | POA: Diagnosis not present

## 2023-12-19 DIAGNOSIS — O99712 Diseases of the skin and subcutaneous tissue complicating pregnancy, second trimester: Secondary | ICD-10-CM | POA: Diagnosis not present

## 2023-12-19 DIAGNOSIS — M7989 Other specified soft tissue disorders: Secondary | ICD-10-CM | POA: Diagnosis not present

## 2023-12-19 DIAGNOSIS — Z7982 Long term (current) use of aspirin: Secondary | ICD-10-CM | POA: Diagnosis not present

## 2023-12-19 MED ORDER — LORATADINE 10 MG PO TABS: 10.0000 mg | ORAL_TABLET | Freq: Every day | ORAL | 0 refills | Status: DC

## 2023-12-19 NOTE — ED Notes (Signed)
Reviewed discharge instructions, medications, and home care with pt. Pt verbalized understanding and had no further questions. Pt exited ED without complications.

## 2023-12-19 NOTE — ED Triage Notes (Signed)
 Pt POV reporting pus coming out of incisions and rash at incision site that has now spread to abd and back of head. Recent R tib/fib frx surgery 4 weeks ago. Pt is also [redacted] weeks pregnant.

## 2023-12-19 NOTE — ED Provider Notes (Signed)
 [place] EMERGENCY DEPARTMENT AT [facility] Provider Note   CSN: [id number] Arrival date & time: 12/19/23  1943     History  Chief Complaint  Patient presents with   Post-op Problem    [name] [name] is a 33 y.o. female who has several concerns related to a rash that has developed around her surgical site as well as exudate from the surgical site and associated complaint of swelling in the right leg.  It is notable that she is gravid and is in [redacted] weeks gestation, further had surgery for repair of a proximal tibial fracture with intramedullary rod placement.  Wound site has been healing well and she has been followed by orthopedics outpatient up to this time, notes that she had some reddish/yellowish discharge from the wound that she noted after eating her dinner this evening.  Further states she has a itchy bumpy rash that has appeared around the knee as well as on the anterior torso beneath the bra line.  She denies any shortness of breath, denies any sensation of her throat feeling closed, and denies any other rash other than a bumpy itchy rash as noted.  HPI     Home Medications Prior to Admission medications   Medication Sig Start Date End Date Taking? Authorizing Provider  loratadine (CLARITIN) 10 MG tablet Take 1 tablet (10 mg total) by mouth daily. 12/19/23  Yes Juanetta Nordmann, PA  acetaminophen  (TYLENOL ) 500 MG tablet Take 2 tablets (1,000 mg total) by mouth every 6 (six) hours as needed for mild pain (pain score 1-3), fever or headache. 11/23/23   Versie Gores, PA-C  aspirin  EC 81 MG tablet Take 2 tablets (162 mg total) by mouth daily. Swallow whole. 11/24/23   Versie Gores, PA-C  cholecalciferol  (CHOLECALCIFEROL ) 25 MCG tablet Take 1 tablet (1,000 Units total) by mouth daily. 11/24/23 02/22/24  Versie Gores, PA-C  ferrous sulfate 325 (65 FE) MG tablet Take 325 mg by mouth daily with breakfast.    [provider]  oxyCODONE  (OXY IR/ROXICODONE ) 5  MG immediate release tablet Take 1-2 tablets (5-10 mg total) by mouth every 4 (four) hours as needed for moderate pain (pain score 4-6) or severe pain (pain score 7-10). 11/23/23   Versie Gores, PA-C  polyethylene glycol (MIRALAX  / GLYCOLAX ) 17 g packet Take 17 g by mouth daily as needed for mild constipation. Patient not taking: Reported on 11/25/2023 11/23/23   Versie Gores, PA-C  Prenatal MV & Min w/FA-DHA (PRENATAL GUMMIES PO) Take 2 tablets by mouth daily.    [provider]      Allergies    Patient has no known allergies.    Review of Systems   Review of Systems  Cardiovascular:  Positive for leg swelling.  Musculoskeletal:  Positive for joint swelling.  Skin:  Positive for rash.  All other systems reviewed and are negative.   Physical Exam Updated Vital Signs BP 125/74   Pulse 95   Temp 97.6 F (36.4 C) (Oral)   Resp 19   LMP 07/19/2023 (Exact Date)   SpO2 99%  Physical Exam Vitals and nursing note reviewed.  Constitutional:      General: She is not in acute distress.    Appearance: Normal appearance.  HENT:     Head: Normocephalic and atraumatic.     Mouth/Throat:     Mouth: Mucous membranes are moist.     Pharynx: Oropharynx is clear.  Eyes:     Extraocular  Movements: Extraocular movements intact.     Conjunctiva/sclera: Conjunctivae normal.     Pupils: Pupils are equal, round, and reactive to light.  Cardiovascular:     Rate and Rhythm: Normal rate and regular rhythm.     Pulses:          Dorsalis pedis pulses are 2+ on the right side.       Posterior tibial pulses are 2+ on the right side.     Heart sounds: Normal heart sounds. No murmur heard.    No friction rub. No gallop.     Comments: Nonpitting edema noted to the right lower extremity. Pulmonary:     Effort: Pulmonary effort is normal.     Breath sounds: Normal breath sounds.  Abdominal:     General: Abdomen is flat. Bowel sounds are normal.     Palpations: Abdomen is soft.   Musculoskeletal:        General: Normal range of motion.     Cervical back: Normal range of motion and neck supple.     Right lower leg: Edema present.     Left lower leg: No edema.  Skin:    General: Skin is warm and dry.     Capillary Refill: Capillary refill takes less than 2 seconds.     Findings: Rash present. Rash is papular. Rash is not scaling, urticarial or vesicular.     Comments: Erythematous and papular rash noted to the anterior right knee, also noted to the anterior torso around the bra line.  Neurological:     General: No focal deficit present.     Mental Status: She is alert. Mental status is at baseline.  Psychiatric:        Mood and Affect: Mood normal.     ED Results / Procedures / Treatments   Labs (all labs ordered are listed, but only abnormal results are displayed) Labs Reviewed - No data to display  EKG None  Radiology No results found.  Procedures Procedures    Medications Ordered in ED Medications - No data to display  ED Course/ Medical Decision Making/ A&P                                 Medical Decision Making Risk OTC drugs.   Medical Decision Making:   [name] is a 33 y.o. female who presented to the ED today with rash, leg swelling, and wound drainage detailed above.     Complete initial physical exam performed, notably the patient  was alert and oriented and in no apparent distress.  Noted erythematous papular rash to the anterior knee as well as to the anterior torso in the area of the bra line.  Evaluated drainage, appears to be serosanguineous in nature, there is no erythema or other purulent drainage nor is the site exquisitely tender indicating risk for infection.  Pulmonary auscultation is unremarkable with no adventitious sounds noted and good air entry to all fields.   Edema on right lower extremity is noted however this is also the extremity that is immobilized in a boot currently as she is still recovering from her  previous orthopedic surgery.  Reviewed and confirmed nursing documentation for past medical history, family history, social history.    Initial Assessment:   With the patient's presentation of skin rash, most likely diagnosis is allergic dermatitis. Other diagnoses were considered including (but not limited to) anaphylaxis, surgical site infection.  These are considered less likely due to history of present illness and physical exam findings.     Initial Plan:  As patient appears stable, has no concerning signs or symptoms, and shared decision making with the patient no further workup will be initiated at this time. Objective evaluation as below reviewed    Reassessment and Plan:   Believe rash at this time to be due to allergic dermatitis.  Will initially manage with oral loratadine , and patient is to follow-up with her primary care provider as noted.  Return precautions given, as there are no signs of acute infection/surgical site infection, will not manage with any antibiotic therapy at this time.          Final Clinical Impression(s) / ED Diagnoses Final diagnoses:  Allergic contact dermatitis, unspecified trigger    Rx / DC Orders ED Discharge Orders          Ordered    loratadine  (CLARITIN ) 10 MG tablet  Daily        12/19/23 2013              Juanetta Nordmann, [place] 12/19/23 2021    Carin Charleston, MD 12/19/23 2256

## 2023-12-27 DIAGNOSIS — S82201D Unspecified fracture of shaft of right tibia, subsequent encounter for closed fracture with routine healing: Secondary | ICD-10-CM | POA: Diagnosis not present

## 2023-12-27 DIAGNOSIS — S82401D Unspecified fracture of shaft of right fibula, subsequent encounter for closed fracture with routine healing: Secondary | ICD-10-CM | POA: Diagnosis not present

## 2024-01-04 DIAGNOSIS — Z362 Encounter for other antenatal screening follow-up: Secondary | ICD-10-CM | POA: Diagnosis not present

## 2024-01-04 DIAGNOSIS — Z3A24 24 weeks gestation of pregnancy: Secondary | ICD-10-CM | POA: Diagnosis not present

## 2024-01-31 DIAGNOSIS — S82401D Unspecified fracture of shaft of right fibula, subsequent encounter for closed fracture with routine healing: Secondary | ICD-10-CM | POA: Diagnosis not present

## 2024-01-31 DIAGNOSIS — S82201D Unspecified fracture of shaft of right tibia, subsequent encounter for closed fracture with routine healing: Secondary | ICD-10-CM | POA: Diagnosis not present

## 2024-02-02 DIAGNOSIS — Z3483 Encounter for supervision of other normal pregnancy, third trimester: Secondary | ICD-10-CM | POA: Diagnosis not present

## 2024-02-02 DIAGNOSIS — Z3A28 28 weeks gestation of pregnancy: Secondary | ICD-10-CM | POA: Diagnosis not present

## 2024-02-02 DIAGNOSIS — Z3480 Encounter for supervision of other normal pregnancy, unspecified trimester: Secondary | ICD-10-CM | POA: Diagnosis not present

## 2024-02-02 DIAGNOSIS — Z23 Encounter for immunization: Secondary | ICD-10-CM | POA: Diagnosis not present

## 2024-02-11 ENCOUNTER — Encounter (HOSPITAL_COMMUNITY): Payer: Self-pay | Admitting: Obstetrics and Gynecology

## 2024-02-11 ENCOUNTER — Inpatient Hospital Stay (HOSPITAL_BASED_OUTPATIENT_CLINIC_OR_DEPARTMENT_OTHER)

## 2024-02-11 ENCOUNTER — Inpatient Hospital Stay (HOSPITAL_COMMUNITY)
Admission: AD | Admit: 2024-02-11 | Discharge: 2024-02-11 | Disposition: A | Discharge status: Home / Self Care | Attending: Obstetrics and Gynecology | Admitting: Obstetrics and Gynecology

## 2024-02-11 DIAGNOSIS — O26893 Other specified pregnancy related conditions, third trimester: Secondary | ICD-10-CM

## 2024-02-11 DIAGNOSIS — Z3A29 29 weeks gestation of pregnancy: Secondary | ICD-10-CM

## 2024-02-11 DIAGNOSIS — O4693 Antepartum hemorrhage, unspecified, third trimester: Secondary | ICD-10-CM | POA: Diagnosis not present

## 2024-02-11 DIAGNOSIS — O26853 Spotting complicating pregnancy, third trimester: Secondary | ICD-10-CM | POA: Diagnosis not present

## 2024-02-11 DIAGNOSIS — N939 Abnormal uterine and vaginal bleeding, unspecified: Secondary | ICD-10-CM

## 2024-02-11 DIAGNOSIS — B9689 Other specified bacterial agents as the cause of diseases classified elsewhere: Secondary | ICD-10-CM | POA: Diagnosis not present

## 2024-02-11 DIAGNOSIS — O23593 Infection of other part of genital tract in pregnancy, third trimester: Secondary | ICD-10-CM | POA: Insufficient documentation

## 2024-02-11 LAB — URINALYSIS, ROUTINE W REFLEX MICROSCOPIC
Bacteria, UA: NONE SEEN
Bilirubin Urine: NEGATIVE
Glucose, UA: NEGATIVE mg/dL
Ketones, ur: NEGATIVE mg/dL
Leukocytes,Ua: NEGATIVE
Nitrite: NEGATIVE
Protein, ur: NEGATIVE mg/dL
Specific Gravity, Urine: 1.015 (ref 1.005–1.030)
pH: 6 (ref 5.0–8.0)

## 2024-02-11 LAB — COMPREHENSIVE METABOLIC PANEL WITH GFR
ALT: 33 U/L (ref 0–44)
AST: 24 U/L (ref 15–41)
Albumin: 2.7 g/dL — ABNORMAL LOW (ref 3.5–5.0)
Alkaline Phosphatase: 87 U/L (ref 38–126)
Anion gap: 11 (ref 5–15)
BUN: 5 mg/dL — ABNORMAL LOW (ref 6–20)
CO2: 19 mmol/L — ABNORMAL LOW (ref 22–32)
Calcium: 9.1 mg/dL (ref 8.9–10.3)
Chloride: 107 mmol/L (ref 98–111)
Creatinine, Ser: 0.52 mg/dL (ref 0.44–1.00)
GFR, Estimated: >60 mL/min (ref >60)
Glucose, Bld: 91 mg/dL (ref 70–99)
Potassium: 3.9 mmol/L (ref 3.5–5.1)
Sodium: 137 mmol/L (ref 135–145)
Total Bilirubin: 0.3 mg/dL (ref 0.0–1.2)
Total Protein: 6.1 g/dL — ABNORMAL LOW (ref 6.5–8.1)

## 2024-02-11 LAB — CBC
HCT: 34.9 % — ABNORMAL LOW (ref 36.0–46.0)
Hemoglobin: 11.5 g/dL — ABNORMAL LOW (ref 12.0–15.0)
MCH: 29.3 pg (ref 26.0–34.0)
MCHC: 33 g/dL (ref 30.0–36.0)
MCV: 89 fL (ref 80.0–100.0)
Platelets: 301 K/uL (ref 150–400)
RBC: 3.92 MIL/uL (ref 3.87–5.11)
RDW: 13.5 % (ref 11.5–15.5)
WBC: 10.9 K/uL — ABNORMAL HIGH (ref 4.0–10.5)
nRBC: 0 % (ref 0.0–0.2)

## 2024-02-11 LAB — POCT FERN TEST: POCT Fern Test: NEGATIVE

## 2024-02-11 LAB — TYPE AND SCREEN
ABO/RH(D): A POS
Antibody Screen: NEGATIVE

## 2024-02-11 LAB — WET PREP, GENITAL
Sperm: NONE SEEN
Trich, Wet Prep: NONE SEEN
WBC, Wet Prep HPF POC: >=10 — AB (ref <10)
Yeast Wet Prep HPF POC: NONE SEEN

## 2024-02-11 LAB — FETAL FIBRONECTIN: Fetal Fibronectin: POSITIVE — AB

## 2024-02-11 MED ORDER — VALACYCLOVIR HCL 500 MG PO TABS: 500.0000 mg | ORAL_TABLET | Freq: Two times a day (BID) | ORAL | 0 refills | Status: AC

## 2024-02-11 MED ORDER — VALACYCLOVIR HCL 500 MG PO TABS
500.0000 mg | ORAL_TABLET | Freq: Once | ORAL | Status: AC
  Administered 2024-02-11: 500 mg via ORAL
  Filled 2024-02-11: qty 1

## 2024-02-11 MED ORDER — METRONIDAZOLE 500 MG PO TABS: 500.0000 mg | ORAL_TABLET | Freq: Two times a day (BID) | ORAL | 0 refills | Status: DC

## 2024-02-11 NOTE — MAU Note (Signed)
..  [name] [name] is a 33 y.o. at [redacted]w[redacted]d here in MAU reporting: about 30 minutes ago she notices a light amount bright red vaginal bleeding. She denies abdominal pain or recent intercourse. She did have  a recent yeast infection she completed terazol series on Tuesday. Denies current vaginal discharge, burning, or pruritus. +FM.   Pain score: 0 Vitals:   02/11/24 1137  BP: 113/64  Pulse: 82  Resp: 18  Temp: 98.1 F (36.7 C)  SpO2: 100%     FHT:152 Lab orders placed from triage:   UA

## 2024-02-11 NOTE — MAU Provider Note (Signed)
 VB    S [name] is [name] 33 y.o. G39P2002 pregnant female at [redacted]w[redacted]d who presents to MAU today with complaint of VB. Pt states prior to presenting to MAU she noticed light amount of BRB in her underwear.  She denies recent coitus or or abdominal pain.  Reports was being treated for vaginal yeast infection and just finished terazol series on 7/15.  Denies current vaginal discharge, pain, pruritus, or LOF.  Reports +FM. Pt states has noticed some irregular braxton hicks ctx that have been non-concerning as well as unchanged this week despite the VB noted today. Of note pt states currently with HSV1 outbreak that started 1 week prior and w/o prodromal symptoms but believes blisters may still be there.   Receives care at [facility]. Prenatal records reviewed. S/p ORIF for fx RLE at [redacted]wks gestation, still on ASA DVT ppx. 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 Mixed anxiety and depression. Hx of LGA requiring VAVD and episiotomy in G1 (5661h)  Pertinent items noted in HPI and remainder of comprehensive ROS otherwise negative.   O BP 113/64 (BP Location: Right Arm)   Pulse 82   Temp 98.1 F (36.7 C) (Oral)   Resp 18   Ht 5' 3 (1.6 m)   Wt 103.9 kg   LMP 07/19/2023 (Exact Date)   SpO2 100%   BMI 40.57 kg/m  Physical Exam Constitutional:      General: She is not in acute distress.    Appearance: Normal appearance. She is not ill-appearing.  HENT:     Head: Normocephalic and atraumatic.     Right Ear: External ear normal.     Left Ear: External ear normal.     Nose: Nose normal. No congestion.     Mouth/Throat:     Mouth: Mucous membranes are moist.     Pharynx: Oropharynx is clear.  Eyes:     Extraocular Movements: Extraocular movements intact.     Conjunctiva/sclera: Conjunctivae normal.  Cardiovascular:     Rate and Rhythm: Normal rate.  Pulmonary:     Effort: Pulmonary effort is normal. No respiratory distress.  Abdominal:     General: There is no  distension.     Palpations: Abdomen is soft.     Tenderness: There is no abdominal tenderness.     Comments: gravid  Genitourinary:    General: Normal vulva.     Vagina: Vaginal discharge (dark blood noted to be trailing from external os, no active bleeding noted) present.     Rectum: Normal.     Comments: No obvious lesions including herpetic lesion externally or within vaginal vault or cervix.  No notable erythema or other vaginal laceration to explain bleeding. SVE C/L/H Musculoskeletal:        General: No swelling. Normal range of motion.     Cervical back: Normal range of motion.  Skin:    General: Skin is warm and dry.     Findings: No lesion or rash.  Neurological:     Mental Status: She is alert and oriented to person, place, and time. Mental status is at baseline.     Motor: No weakness.     Gait: Gait normal.  Psychiatric:        Mood and Affect: Mood normal.        Behavior: Behavior normal.    NST: 135bpm, moderate variability, +accels for GA, no decels, no ctx just some irritability   MDM: MAU Course:  Pt reporting some irregular  contractions previously this week prior to new onset VB so collected FFN prior to other swabs.  Speculum exam with dark blood trailing from external os w/o signs of active / ongoing bleeding.  SVE C/L/H.  Will treat for exacerbation of HSV1 given pt still with some symptomatology believing she feels the lesions there though none seen on exam, starting Valtrex  500mg  BID.   FFN Positive in s/o bleeding  Fern negative  GC collected  Wet prep clue cells present  UA noninfectious, moderate Hgb with RBC seen CBC Hgb 11.5, leukocytosis expected for GA CMP reassuring  T/S collected in case admission (previously [name]+)  US     Media Information      Document Information  Ultrasound  U/S MFM OB + 14  02/11/2024 13:29  Attached To:  US  MFM OB COMP + 14 WK [[id number]]  Hospital Encounter on 02/11/24  Source Information  [name] [name], [name] [name]   [facility] Ul[facility]ound   Spoke with Attending Dr. Jayne and Private Attending Dr. Lequita who both are okay with d/c given total clinical picture.  Dr. Lequita to have her assessed in office 7/21.     AP #[redacted] weeks gestation #Third Trimester bleeding  #BV - sent flagyl    Discharge from MAU in stable condition with strict/usual precautions Follow up at [facility] as scheduled for ongoing prenatal care  Allergies as of 02/11/2024   No Known Allergies      Medication List     TAKE these medications    acetaminophen  500 MG tablet Commonly known as: TYLENOL  Take 2 tablets (1,000 mg total) by mouth every 6 (six) hours as needed for mild pain (pain score 1-3), fever or headache.   aspirin  EC 81 MG tablet Take 2 tablets (162 mg total) by mouth daily. Swallow whole.   ferrous sulfate 325 (65 FE) MG tablet Take 325 mg by mouth daily with breakfast.   loratadine  10 MG tablet Commonly known as: CLARITIN  Take 1 tablet (10 mg total) by mouth daily.   metroNIDAZOLE  500 MG tablet Commonly known as: FLAGYL  Take 1 tablet (500 mg total) by mouth 2 (two) times daily.   oxyCODONE  5 MG immediate release tablet Commonly known as: Oxy IR/ROXICODONE  Take 1-2 tablets (5-10 mg total) by mouth every 4 (four) hours as needed for moderate pain (pain score 4-6) or severe pain (pain score 7-10).   polyethylene glycol 17 g packet Commonly known as: MIRALAX  / GLYCOLAX  Take 17 g by mouth daily as needed for mild constipation.   PRENATAL GUMMIES PO Take 2 tablets by mouth daily.   valACYclovir  500 MG tablet Commonly known as: VALTREX  Take 500 mg by mouth 2 (two) times daily. Pt unsure of dose has not picked up yet What changed: Another medication with the same name was added. Make sure you understand how and when to take each.   valACYclovir  500 MG tablet Commonly known as: Valtrex  Take 1 tablet (500 mg total) by mouth 2 (two) times daily. What changed: You were already taking [name] medication with the same  name, and this prescription was added. Make sure you understand how and when to take each.   vitamin D3 25 MCG tablet Commonly known as: CHOLECALCIFEROL  Take 1 tablet (1,000 Units total) by mouth daily.        Jhonny Augustin BROCKS, MD 02/11/2024 2:03 PM

## 2024-02-13 LAB — GC/CHLAMYDIA PROBE AMP (~~LOC~~) NOT AT ARMC
Chlamydia: NEGATIVE
Comment: NEGATIVE
Comment: NORMAL
Neisseria Gonorrhea: NEGATIVE

## 2024-03-03 ENCOUNTER — Inpatient Hospital Stay (HOSPITAL_COMMUNITY)
Admission: AD | Admit: 2024-03-03 | Discharge: 2024-03-03 | Disposition: A | Discharge status: Home / Self Care | Attending: Obstetrics and Gynecology | Admitting: Obstetrics and Gynecology

## 2024-03-03 ENCOUNTER — Encounter (HOSPITAL_COMMUNITY): Payer: Self-pay | Admitting: Obstetrics and Gynecology

## 2024-03-03 DIAGNOSIS — F17290 Nicotine dependence, other tobacco product, uncomplicated: Secondary | ICD-10-CM | POA: Insufficient documentation

## 2024-03-03 DIAGNOSIS — Z3A32 32 weeks gestation of pregnancy: Secondary | ICD-10-CM | POA: Diagnosis not present

## 2024-03-03 DIAGNOSIS — O99213 Obesity complicating pregnancy, third trimester: Secondary | ICD-10-CM | POA: Diagnosis not present

## 2024-03-03 DIAGNOSIS — Z3689 Encounter for other specified antenatal screening: Secondary | ICD-10-CM | POA: Diagnosis not present

## 2024-03-03 DIAGNOSIS — Z7982 Long term (current) use of aspirin: Secondary | ICD-10-CM | POA: Diagnosis not present

## 2024-03-03 DIAGNOSIS — O99333 Smoking (tobacco) complicating pregnancy, third trimester: Secondary | ICD-10-CM | POA: Diagnosis not present

## 2024-03-03 DIAGNOSIS — O368130 Decreased fetal movements, third trimester, not applicable or unspecified: Secondary | ICD-10-CM

## 2024-03-03 NOTE — MAU Note (Signed)
 [name] [name] is a 33 y.o. at [redacted]w[redacted]d here in MAU reporting: she has only felt the baby move maybe 10 times all day today until she was in the waiting room when she started feeling more movement. Reports the movements are not as vigorous. Pt denies bleeding., ROM, or contractions.   Onset of complaint: today Pain score: 0 There were no vitals filed for this visit.   FHT: 128  Lab orders placed from triage: none

## 2024-03-03 NOTE — MAU Provider Note (Signed)
 MAU Provider Note  Chief Complaint: Decreased Fetal Movement   Event Date/Time   First Provider Initiated Contact with Patient 03/03/24 2145      SUBJECTIVE HPI: [name] [name] is a 33 y.o. G3P2002 at [redacted]w[redacted]d by LMP who presents to maternity admissions reporting DFM. Pregnancy c/b obesity, orthopedic fracture. Receives [facility] with [facility].  Patient notes only having 10 fetal movements throughout the day.  Typically baby is very active after eating and has not been today.  Movements seem to be less strong than usual as well.  After arriving to the MAU, she started having an increase in movements, however they do not feel as strong as they usually are.  She denies any vaginal bleeding, loss of fluid, contractions, cramping, urinary symptoms, change in discharge.  HPI  Past Medical History:  Diagnosis Date   Abnormal Pap smear of cervix    Anxiety    Breast feeding status of mother    Migraine    Past Surgical History:  Procedure Laterality Date   TIBIA IM NAIL INSERTION Right 11/21/2023   Procedure: INSERTION, INTRAMEDULLARY ROD, TIBIA;  Surgeon: Kendal Franky SQUIBB, MD;  Location: [facility] OR;  Service: Orthopedics;  Laterality: Right;   TONSILLECTOMY     Social History   Socioeconomic History   Marital status: Married    Spouse name: Not on file   Number of children: 2   Years of education: Not on file   Highest education level: Not on file  Occupational History   Not on file  Tobacco Use   Smoking status: Former    Types: E-cigarettes, Cigarettes    Passive exposure: Never   Smokeless tobacco: Never  Vaping Use   Vaping status: Former  Substance and Sexual Activity   Alcohol use: Not Currently    Comment: occ   Drug use: Not Currently    Types: Marijuana   Sexual activity: Yes    Birth control/protection: None  Other Topics Concern   Not on file  Social History Narrative   Not on file   Social Drivers of Health   Financial Resource Strain: Not on file   Food Insecurity: No Food Insecurity (11/21/2023)   Hunger Vital Sign    Worried About Running Out of Food in the Last Year: Never true    Ran Out of Food in the Last Year: Never true  Transportation Needs: No Transportation Needs (11/21/2023)   PRAPARE - Administrator, Civil Service (Medical): No    Lack of Transportation (Non-Medical): No  Physical Activity: Not on file  Stress: Not on file  Social Connections: Not on file  Intimate Partner Violence: Not At Risk (11/21/2023)   Humiliation, Afraid, Rape, and Kick questionnaire    Fear of Current or Ex-Partner: No    Emotionally Abused: No    Physically Abused: No    Sexually Abused: No   No current facility-administered medications on file prior to encounter.   Current Outpatient Medications on File Prior to Encounter  Medication Sig Dispense Refill   ferrous sulfate 325 (65 FE) MG tablet Take 325 mg by mouth daily with breakfast.     Prenatal MV & Min w/FA-DHA (PRENATAL GUMMIES PO) Take 2 tablets by mouth daily.     valACYclovir  (VALTREX ) 500 MG tablet Take 500 mg by mouth 2 (two) times daily. Pt unsure of dose has not picked up yet     acetaminophen  (TYLENOL ) 500 MG tablet Take 2 tablets (1,000 mg total) by  mouth every 6 (six) hours as needed for mild pain (pain score 1-3), fever or headache.     aspirin  EC 81 MG tablet Take 2 tablets (162 mg total) by mouth daily. Swallow whole. 30 tablet 12   loratadine  (CLARITIN ) 10 MG tablet Take 1 tablet (10 mg total) by mouth daily. 30 tablet 0   metroNIDAZOLE  (FLAGYL ) 500 MG tablet Take 1 tablet (500 mg total) by mouth 2 (two) times daily. 14 tablet 0   oxyCODONE  (OXY IR/ROXICODONE ) 5 MG immediate release tablet Take 1-2 tablets (5-10 mg total) by mouth every 4 (four) hours as needed for moderate pain (pain score 4-6) or severe pain (pain score 7-10). 42 tablet 0   polyethylene glycol (MIRALAX  / GLYCOLAX ) 17 g packet Take 17 g by mouth daily as needed for mild constipation. (Patient  not taking: Reported on 11/25/2023)     valACYclovir  (VALTREX ) 500 MG tablet Take 1 tablet (500 mg total) by mouth 2 (two) times daily. 6 tablet 0   No Known Allergies  ROS:  Pertinent positives/negatives listed above.  I have reviewed patient's Past Medical Hx, Surgical Hx, Family Hx, Social Hx, medications and allergies.   Physical Exam  Patient Vitals for the past 24 hrs:  BP Temp Temp src Pulse Resp SpO2 Height Weight  03/03/24 2238 127/78 -- -- 82 -- -- -- --  03/03/24 2229 -- -- -- -- -- 100 % -- --  03/03/24 2224 -- -- -- -- -- 99 % -- --  03/03/24 2219 -- -- -- -- -- 98 % -- --  03/03/24 2214 -- -- -- -- -- 99 % -- --  03/03/24 2209 -- -- -- -- -- 100 % -- --  03/03/24 2204 -- -- -- -- -- 98 % -- --  03/03/24 2159 -- -- -- -- -- 98 % -- --  03/03/24 2154 -- -- -- -- -- 98 % -- --  03/03/24 2149 -- -- -- -- -- 98 % -- --  03/03/24 2144 -- -- -- -- -- 98 % -- --  03/03/24 2139 -- -- -- -- -- 98 % -- --  03/03/24 2134 122/77 -- -- 85 -- 96 % -- --  03/03/24 2133 122/77 98.7 F (37.1 C) Oral 85 16 98 % 5' 3 (1.6 m) 106.6 kg   Constitutional: Well-developed, well-nourished female in no acute distress  Cardiovascular: normal rate Respiratory: normal effort GI: Abd soft, non-tender, gravid MS: Extremities nontender, no edema, normal ROM Neurologic: Alert and oriented x 4   FHT:  Baseline 130, moderate variability, accelerations present, no decelerations Contractions: quiet  LAB RESULTS No results found for this or any previous visit (from the past 24 hours).  --/--/A POS (07/19 1152)  IMAGING US  MFM OB COMP + 14 WK Result Date: 02/12/2024 ----------------------------------------------------------------------  OBSTETRICS REPORT                        (Signed Final 02/12/2024 10:10 am) ---------------------------------------------------------------------- Patient Info  ID #:       [id number]                          D.O.B.:  09-29-1990 (32 yrs)(F)  Name:       [name] [name]                Visit Date: 02/11/2024 01:23 pm ---------------------------------------------------------------------- Performed By  Attending:        Steffan Keys  MD         Referred By:       [facility] MAU/Triage  Performed By:     Sharlet LABOR Summer        [facility] ---------------------------------------------------------------------- Orders  #  Description                           Code        Ordered By  1  US  MFM OB COMP + 14 WK                76805.01    CASEY CHUBB ----------------------------------------------------------------------  #  Order #                     Accession #                Episode #  1  [id number]                   [id number]                 [id number] ---------------------------------------------------------------------- Indications  Vaginal bleeding in pregnancy, third trimester  O46.93  [redacted] weeks gestation of pregnancy                 Z3A.29 ---------------------------------------------------------------------- Fetal Evaluation  Num Of Fetuses:          1  Fetal Heart Rate(bpm):   131  Cardiac Activity:        Observed  Presentation:            Cephalic  Placenta:                Anterior  Amniotic Fluid  AFI FV:      Within normal limits  AFI Sum(cm)     %Tile       Largest Pocket(cm)  15.7            56          5.3  RUQ(cm)       RLQ(cm)       LUQ(cm)        LLQ(cm)  5.3           3.4           4.4            2.6 ---------------------------------------------------------------------- Biometry  BPD:        78  mm     G. Age:  31w 2d         87  %    CI:        76.19   %    70 - 86                                                          FL/HC:       20.3  %  19.2 - 21.4  HC:      283.2  mm     G. Age:  31w 0d         60  %    HC/AC:       1.06       0.99 - 1.21  AC:      268.4  mm     G. Age:  31w 0d         83  %    FL/BPD:      73.7  %    71 - 87  FL:       57.5  mm     G. Age:  30w 1d          51  %    FL/AC:       21.4  %    20 - 24  HUM:      49.8  mm     G. Age:  29w 2d         41  %  CER:      37.6  mm     G. Age:  31w 0d         82  %  LV:        6.3  mm  Est. FW:    1627   gm     3 lb 9 oz     78  % ---------------------------------------------------------------------- OB History  Gravidity:    3         Term:   2  Living:       2 ---------------------------------------------------------------------- Gestational Age  LMP:           29w 4d        Date:  07/19/23                 EDD:   04/24/24  U/S Today:     30w 6d                                        EDD:   04/15/24  Best:          29w 4d     Det. By:  LMP  (07/19/23)          EDD:   04/24/24 ---------------------------------------------------------------------- Anatomy  Cranium:               Appears normal         Aortic Arch:            Not well visualized  Cavum:                 Appears normal         Ductal Arch:            Not well visualized  Ventricles:            Appears normal         Diaphragm:              Appears normal  Choroid Plexus:        Not well visualized    Stomach:                Appears normal, left  sided  Cerebellum:            Appears normal         Abdomen:                Appears normal  Posterior Fossa:       Appears normal         Abdominal Wall:         Not well visualized  Nuchal Fold:           Not applicable (>20    Cord Vessels:           Appears normal ([redacted]                         wks GA)                                        vessel cord)  Face:                  Not well visualized    Kidneys:                Appear normal  Lips:                  Not well visualized    Bladder:                Appears normal  Thoracic:              Appears normal         Spine:                  Appears normal  Heart:                 Appears normal         Upper Extremities:      Not well visualized                         (4CH, axis, and                          situs)  RVOT:                  Not well visualized    Lower Extremities:      Not well visualized  LVOT:                  Not well visualized  Other:  Fetus appears to be a female. Technically difficult due to advanced GA          and fetal position. ---------------------------------------------------------------------- Cervix Uterus Adnexa  Cervix  Not visualized (advanced GA >24wks) ---------------------------------------------------------------------- Comments  This patient presented to the MAU due to vaginal bleeding.  An ultrasound performed today showed an EFW of 3 pounds  9 ounces (78th percentile).  There was normal amniotic fluid noted with a total AFI of 15.7  cm.  The fetus was in the vertex presentation.  A normal-appearing anterior placenta was noted without any  signs of placenta previa.   The views of the fetal anatomy were limited today due to her  advanced gestational age. ----------------------------------------------------------------------                   Steffan Keys, MD Electronically  Signed Final Report   02/12/2024 10:10 am ----------------------------------------------------------------------    MAU Management/MDM: Orders Placed This Encounter  Procedures   Discharge patient    No orders of the defined types were placed in this encounter.    Available prenatal records reviewed.  Patient presents with decreased fetal movement at [redacted]w[redacted]d gestation.  She did have a pick up and fetal movement after arrival to MAU.  Additionally fetal heart tones are beautifully reactive.  Discussed with patient that a reactive NST is very reassuring as to fetal status.  Discussed that we can monitor for 1 hour.  If tracing becomes nonreactive would proceed with a BPP.  Reassessed at one hour mark. Continues to have a beautifully reactive tracing. Quiet toco. Patient notes increased fetal movement as well and has had >10 movements in past hour. Discussed kick counts, reasons for  return.  ASSESSMENT 1. Decreased fetal movements in third trimester, single or unspecified fetus   2. NST (non-stress test) reactive     PLAN Discharge home with strict return precautions. Allergies as of 03/03/2024   No Known Allergies      Medication List     STOP taking these medications    metroNIDAZOLE  500 MG tablet Commonly known as: FLAGYL    polyethylene glycol 17 g packet Commonly known as: MIRALAX  / GLYCOLAX        TAKE these medications    acetaminophen  500 MG tablet Commonly known as: TYLENOL  Take 2 tablets (1,000 mg total) by mouth every 6 (six) hours as needed for mild pain (pain score 1-3), fever or headache.   aspirin  EC 81 MG tablet Take 2 tablets (162 mg total) by mouth daily. Swallow whole.   ferrous sulfate 325 (65 FE) MG tablet Take 325 mg by mouth daily with breakfast.   loratadine  10 MG tablet Commonly known as: CLARITIN  Take 1 tablet (10 mg total) by mouth daily.   oxyCODONE  5 MG immediate release tablet Commonly known as: Oxy IR/ROXICODONE  Take 1-2 tablets (5-10 mg total) by mouth every 4 (four) hours as needed for moderate pain (pain score 4-6) or severe pain (pain score 7-10).   PRENATAL GUMMIES PO Take 2 tablets by mouth daily.   valACYclovir  500 MG tablet Commonly known as: Valtrex  Take 1 tablet (500 mg total) by mouth 2 (two) times daily. What changed: Another medication with the same name was removed. Continue taking this medication, and follow the directions you see here.         Almarie Moats, MD OB Fellow 03/03/2024  10:38 PM

## 2024-03-16 DIAGNOSIS — Z3A34 34 weeks gestation of pregnancy: Secondary | ICD-10-CM | POA: Diagnosis not present

## 2024-03-16 DIAGNOSIS — Z3483 Encounter for supervision of other normal pregnancy, third trimester: Secondary | ICD-10-CM | POA: Diagnosis not present

## 2024-03-16 DIAGNOSIS — N760 Acute vaginitis: Secondary | ICD-10-CM | POA: Diagnosis not present

## 2024-03-16 DIAGNOSIS — B009 Herpesviral infection, unspecified: Secondary | ICD-10-CM | POA: Diagnosis not present

## 2024-03-26 LAB — OB RESULTS CONSOLE GBS: GBS: NEGATIVE

## 2024-03-28 DIAGNOSIS — O26843 Uterine size-date discrepancy, third trimester: Secondary | ICD-10-CM | POA: Diagnosis not present

## 2024-03-28 DIAGNOSIS — Z3483 Encounter for supervision of other normal pregnancy, third trimester: Secondary | ICD-10-CM | POA: Diagnosis not present

## 2024-03-28 DIAGNOSIS — Z3A36 36 weeks gestation of pregnancy: Secondary | ICD-10-CM | POA: Diagnosis not present

## 2024-03-28 DIAGNOSIS — Z3685 Encounter for antenatal screening for Streptococcus B: Secondary | ICD-10-CM | POA: Diagnosis not present

## 2024-04-04 DIAGNOSIS — Z3400 Encounter for supervision of normal first pregnancy, unspecified trimester: Secondary | ICD-10-CM | POA: Diagnosis not present

## 2024-04-04 DIAGNOSIS — O99891 Other specified diseases and conditions complicating pregnancy: Secondary | ICD-10-CM | POA: Diagnosis not present

## 2024-04-04 DIAGNOSIS — Z3A37 37 weeks gestation of pregnancy: Secondary | ICD-10-CM | POA: Diagnosis not present

## 2024-04-15 ENCOUNTER — Inpatient Hospital Stay (HOSPITAL_COMMUNITY)
Admission: AD | Admit: 2024-04-15 | Discharge: 2024-04-17 | DRG: 788 | Disposition: A | Discharge status: Home / Self Care | Attending: Obstetrics & Gynecology | Admitting: Obstetrics & Gynecology

## 2024-04-15 ENCOUNTER — Encounter (HOSPITAL_COMMUNITY): Payer: Self-pay | Admitting: Obstetrics & Gynecology

## 2024-04-15 ENCOUNTER — Other Ambulatory Visit: Payer: Self-pay

## 2024-04-15 ENCOUNTER — Inpatient Hospital Stay (HOSPITAL_COMMUNITY): Admitting: Anesthesiology

## 2024-04-15 ENCOUNTER — Encounter (HOSPITAL_COMMUNITY)
Admission: AD | Disposition: A | Discharge status: Home / Self Care | Payer: Self-pay | Source: Home / Self Care | Attending: Obstetrics & Gynecology

## 2024-04-15 DIAGNOSIS — A6000 Herpesviral infection of urogenital system, unspecified: Secondary | ICD-10-CM | POA: Diagnosis not present

## 2024-04-15 DIAGNOSIS — E66813 Obesity, class 3: Secondary | ICD-10-CM | POA: Diagnosis not present

## 2024-04-15 DIAGNOSIS — Z833 Family history of diabetes mellitus: Secondary | ICD-10-CM

## 2024-04-15 DIAGNOSIS — Z98891 History of uterine scar from previous surgery: Secondary | ICD-10-CM

## 2024-04-15 DIAGNOSIS — O98513 Other viral diseases complicating pregnancy, third trimester: Principal | ICD-10-CM

## 2024-04-15 DIAGNOSIS — O2643 Herpes gestationis, third trimester: Secondary | ICD-10-CM | POA: Diagnosis not present

## 2024-04-15 DIAGNOSIS — Z8249 Family history of ischemic heart disease and other diseases of the circulatory system: Secondary | ICD-10-CM | POA: Diagnosis not present

## 2024-04-15 DIAGNOSIS — Z23 Encounter for immunization: Secondary | ICD-10-CM | POA: Diagnosis not present

## 2024-04-15 DIAGNOSIS — Z87891 Personal history of nicotine dependence: Secondary | ICD-10-CM | POA: Diagnosis not present

## 2024-04-15 DIAGNOSIS — O99214 Obesity complicating childbirth: Secondary | ICD-10-CM | POA: Diagnosis present

## 2024-04-15 DIAGNOSIS — O9832 Other infections with a predominantly sexual mode of transmission complicating childbirth: Principal | ICD-10-CM | POA: Diagnosis present

## 2024-04-15 DIAGNOSIS — B009 Herpesviral infection, unspecified: Principal | ICD-10-CM | POA: Diagnosis present

## 2024-04-15 DIAGNOSIS — O26893 Other specified pregnancy related conditions, third trimester: Secondary | ICD-10-CM | POA: Diagnosis not present

## 2024-04-15 DIAGNOSIS — Z3A38 38 weeks gestation of pregnancy: Secondary | ICD-10-CM | POA: Diagnosis not present

## 2024-04-15 DIAGNOSIS — Z051 Observation and evaluation of newborn for suspected infectious condition ruled out: Secondary | ICD-10-CM | POA: Diagnosis not present

## 2024-04-15 DIAGNOSIS — O9852 Other viral diseases complicating childbirth: Secondary | ICD-10-CM | POA: Diagnosis not present

## 2024-04-15 LAB — COMPREHENSIVE METABOLIC PANEL WITH GFR
ALT: 16 U/L (ref 0–44)
AST: 19 U/L (ref 15–41)
Albumin: 2.7 g/dL — ABNORMAL LOW (ref 3.5–5.0)
Alkaline Phosphatase: 92 U/L (ref 38–126)
Anion gap: 12 (ref 5–15)
BUN: 7 mg/dL (ref 6–20)
CO2: 18 mmol/L — ABNORMAL LOW (ref 22–32)
Calcium: 8.8 mg/dL — ABNORMAL LOW (ref 8.9–10.3)
Chloride: 107 mmol/L (ref 98–111)
Creatinine, Ser: 0.58 mg/dL (ref 0.44–1.00)
GFR, Estimated: >60 mL/min (ref >60)
Glucose, Bld: 86 mg/dL (ref 70–99)
Potassium: 4 mmol/L (ref 3.5–5.1)
Sodium: 137 mmol/L (ref 135–145)
Total Bilirubin: 0.4 mg/dL (ref 0.0–1.2)
Total Protein: 6.1 g/dL — ABNORMAL LOW (ref 6.5–8.1)

## 2024-04-15 LAB — PROTEIN / CREATININE RATIO, URINE
Creatinine, Urine: 44 mg/dL
Total Protein, Urine: <6 mg/dL

## 2024-04-15 LAB — CBC
HCT: 36.3 % (ref 36.0–46.0)
Hemoglobin: 11.9 g/dL — ABNORMAL LOW (ref 12.0–15.0)
MCH: 29.9 pg (ref 26.0–34.0)
MCHC: 32.8 g/dL (ref 30.0–36.0)
MCV: 91.2 fL (ref 80.0–100.0)
Platelets: 232 K/uL (ref 150–400)
RBC: 3.98 MIL/uL (ref 3.87–5.11)
RDW: 15 % (ref 11.5–15.5)
WBC: 10.7 K/uL — ABNORMAL HIGH (ref 4.0–10.5)
nRBC: 0 % (ref 0.0–0.2)

## 2024-04-15 LAB — TYPE AND SCREEN
ABO/RH(D): A POS
Antibody Screen: NEGATIVE

## 2024-04-15 LAB — HIV ANTIBODY (ROUTINE TESTING W REFLEX): HIV Screen 4th Generation wRfx: NONREACTIVE

## 2024-04-15 SURGERY — Surgical Case
Anesthesia: Spinal

## 2024-04-15 MED ORDER — MEPERIDINE HCL 25 MG/ML IJ SOLN: 6.2500 mg | INTRAMUSCULAR | Status: DC | PRN

## 2024-04-15 MED ORDER — SODIUM CHLORIDE 0.9 % IV SOLN
INTRAVENOUS | Status: DC | PRN
  Administered 2024-04-15: 500 mg via INTRAVENOUS

## 2024-04-15 MED ORDER — SODIUM CHLORIDE 0.9% FLUSH: 3.0000 mL | INTRAVENOUS | Status: DC | PRN

## 2024-04-15 MED ORDER — BUPIVACAINE IN DEXTROSE 0.75-8.25 % IT SOLN
INTRATHECAL | Status: DC | PRN
  Administered 2024-04-15: 1.6 mL via INTRATHECAL

## 2024-04-15 MED ORDER — LACTATED RINGERS IV SOLN: INTRAVENOUS | Status: AC

## 2024-04-15 MED ORDER — STERILE WATER FOR IRRIGATION IR SOLN
Status: DC | PRN
  Administered 2024-04-15: 1

## 2024-04-15 MED ORDER — SIMETHICONE 80 MG PO CHEW: 80.0000 mg | CHEWABLE_TABLET | ORAL | Status: DC | PRN

## 2024-04-15 MED ORDER — KETOROLAC TROMETHAMINE 30 MG/ML IJ SOLN
30.0000 mg | Freq: Once | INTRAMUSCULAR | Status: AC | PRN
  Administered 2024-04-15: 30 mg via INTRAVENOUS

## 2024-04-15 MED ORDER — LACTATED RINGERS IV SOLN: INTRAVENOUS | Status: DC

## 2024-04-15 MED ORDER — IBUPROFEN 600 MG PO TABS
600.0000 mg | ORAL_TABLET | Freq: Four times a day (QID) | ORAL | Status: DC
  Administered 2024-04-15 – 2024-04-17 (×8): 600 mg via ORAL
  Filled 2024-04-15 (×8): qty 1

## 2024-04-15 MED ORDER — NALOXONE HCL 0.4 MG/ML IJ SOLN: 0.4000 mg | INTRAMUSCULAR | Status: DC | PRN

## 2024-04-15 MED ORDER — DIPHENHYDRAMINE HCL 25 MG PO CAPS: 25.0000 mg | ORAL_CAPSULE | Freq: Four times a day (QID) | ORAL | Status: DC | PRN

## 2024-04-15 MED ORDER — ONDANSETRON HCL 4 MG/2ML IJ SOLN: 4.0000 mg | Freq: Three times a day (TID) | INTRAMUSCULAR | Status: DC | PRN

## 2024-04-15 MED ORDER — POVIDONE-IODINE 10 % EX SWAB: 2.0000 | Freq: Once | CUTANEOUS | Status: DC

## 2024-04-15 MED ORDER — ONDANSETRON HCL 4 MG/2ML IJ SOLN
INTRAMUSCULAR | Status: DC | PRN
Start: 2024-04-15 — End: 2024-04-15
  Administered 2024-04-15: 4 mg via INTRAVENOUS

## 2024-04-15 MED ORDER — SIMETHICONE 80 MG PO CHEW
80.0000 mg | CHEWABLE_TABLET | Freq: Three times a day (TID) | ORAL | Status: DC
  Administered 2024-04-15 – 2024-04-17 (×6): 80 mg via ORAL
  Filled 2024-04-15 (×7): qty 1

## 2024-04-15 MED ORDER — SCOPOLAMINE 1 MG/3DAYS TD PT72
MEDICATED_PATCH | TRANSDERMAL | Status: AC
  Filled 2024-04-15: qty 1

## 2024-04-15 MED ORDER — KETOROLAC TROMETHAMINE 30 MG/ML IJ SOLN
INTRAMUSCULAR | Status: AC
  Filled 2024-04-15: qty 1

## 2024-04-15 MED ORDER — OXYCODONE HCL 5 MG PO TABS: 5.0000 mg | ORAL_TABLET | Freq: Once | ORAL | Status: DC | PRN

## 2024-04-15 MED ORDER — SODIUM CHLORIDE 0.9 % IV SOLN
500.0000 mg | INTRAVENOUS | Status: DC
  Filled 2024-04-15: qty 5

## 2024-04-15 MED ORDER — SOD CITRATE-CITRIC ACID 500-334 MG/5ML PO SOLN
30.0000 mL | Freq: Once | ORAL | Status: AC
Start: 2024-04-15 — End: 2024-04-15

## 2024-04-15 MED ORDER — AMISULPRIDE (ANTIEMETIC) 5 MG/2ML IV SOLN: 10.0000 mg | Freq: Once | INTRAVENOUS | Status: DC | PRN

## 2024-04-15 MED ORDER — DIBUCAINE (PERIANAL) 1 % EX OINT: 1.0000 | TOPICAL_OINTMENT | CUTANEOUS | Status: DC | PRN

## 2024-04-15 MED ORDER — ACETAMINOPHEN 10 MG/ML IV SOLN
INTRAVENOUS | Status: DC | PRN
  Administered 2024-04-15: 1000 mg via INTRAVENOUS

## 2024-04-15 MED ORDER — FERROUS SULFATE 325 (65 FE) MG PO TABS
325.0000 mg | ORAL_TABLET | ORAL | Status: DC
  Administered 2024-04-16: 325 mg via ORAL
  Filled 2024-04-15 (×4): qty 1

## 2024-04-15 MED ORDER — SODIUM CHLORIDE 0.9 % IR SOLN
Status: DC | PRN
  Administered 2024-04-15: 1

## 2024-04-15 MED ORDER — OXYCODONE HCL 5 MG PO TABS
5.0000 mg | ORAL_TABLET | ORAL | Status: DC | PRN
  Administered 2024-04-16: 5 mg via ORAL
  Filled 2024-04-15: qty 1

## 2024-04-15 MED ORDER — DIPHENHYDRAMINE HCL 50 MG/ML IJ SOLN: 12.5000 mg | Freq: Four times a day (QID) | INTRAMUSCULAR | Status: DC | PRN

## 2024-04-15 MED ORDER — ACETAMINOPHEN 10 MG/ML IV SOLN: 1000.0000 mg | Freq: Once | INTRAVENOUS | Status: DC | PRN

## 2024-04-15 MED ORDER — LACTATED RINGERS IV BOLUS
1000.0000 mL | Freq: Once | INTRAVENOUS | Status: AC
  Administered 2024-04-15: 1000 mL via INTRAVENOUS

## 2024-04-15 MED ORDER — SCOPOLAMINE 1 MG/3DAYS TD PT72
1.0000 | MEDICATED_PATCH | Freq: Once | TRANSDERMAL | Status: DC
Start: 2024-04-15 — End: 2024-04-17
  Administered 2024-04-15: 1 mg via TRANSDERMAL

## 2024-04-15 MED ORDER — FENTANYL CITRATE (PF) 100 MCG/2ML IJ SOLN
25.0000 ug | INTRAMUSCULAR | Status: DC | PRN
  Administered 2024-04-15 (×2): 25 ug via INTRAVENOUS
  Administered 2024-04-15: 50 ug via INTRAVENOUS

## 2024-04-15 MED ORDER — COCONUT OIL OIL: 1.0000 | TOPICAL_OIL | Status: DC | PRN

## 2024-04-15 MED ORDER — CEFAZOLIN SODIUM-DEXTROSE 2-3 GM-%(50ML) IV SOLR
INTRAVENOUS | Status: DC | PRN
  Administered 2024-04-15: 2 g via INTRAVENOUS

## 2024-04-15 MED ORDER — SENNOSIDES-DOCUSATE SODIUM 8.6-50 MG PO TABS
2.0000 | ORAL_TABLET | Freq: Every day | ORAL | Status: DC
  Administered 2024-04-16 – 2024-04-17 (×2): 2 via ORAL
  Filled 2024-04-15 (×3): qty 2

## 2024-04-15 MED ORDER — OXYTOCIN-SODIUM CHLORIDE 30-0.9 UT/500ML-% IV SOLN
INTRAVENOUS | Status: DC | PRN
  Administered 2024-04-15: 300 mL via INTRAVENOUS

## 2024-04-15 MED ORDER — SOD CITRATE-CITRIC ACID 500-334 MG/5ML PO SOLN
ORAL | Status: AC
  Administered 2024-04-15: 30 mL via ORAL
  Filled 2024-04-15: qty 30

## 2024-04-15 MED ORDER — LACTATED RINGERS IV SOLN: INTRAVENOUS | Status: DC | PRN

## 2024-04-15 MED ORDER — MORPHINE SULFATE (PF) 0.5 MG/ML IJ SOLN
INTRAMUSCULAR | Status: AC
  Filled 2024-04-15: qty 10

## 2024-04-15 MED ORDER — MORPHINE SULFATE (PF) 0.5 MG/ML IJ SOLN
INTRAMUSCULAR | Status: DC | PRN
  Administered 2024-04-15: 150 ug via INTRATHECAL

## 2024-04-15 MED ORDER — NALOXONE HCL 4 MG/10ML IJ SOLN: 1.0000 ug/kg/h | INTRAVENOUS | Status: DC | PRN

## 2024-04-15 MED ORDER — MENTHOL 3 MG MT LOZG: 1.0000 | LOZENGE | OROMUCOSAL | Status: DC | PRN

## 2024-04-15 MED ORDER — TRANEXAMIC ACID-NACL 1000-0.7 MG/100ML-% IV SOLN
INTRAVENOUS | Status: DC | PRN
  Administered 2024-04-15: 1000 mg via INTRAVENOUS

## 2024-04-15 MED ORDER — PRENATAL MULTIVITAMIN CH
1.0000 | ORAL_TABLET | Freq: Every day | ORAL | Status: DC
  Administered 2024-04-16 – 2024-04-17 (×2): 1 via ORAL
  Filled 2024-04-15 (×2): qty 1

## 2024-04-15 MED ORDER — ACETAMINOPHEN 500 MG PO TABS
1000.0000 mg | ORAL_TABLET | Freq: Four times a day (QID) | ORAL | Status: DC
  Administered 2024-04-15 – 2024-04-17 (×8): 1000 mg via ORAL
  Filled 2024-04-15 (×8): qty 2

## 2024-04-15 MED ORDER — FENTANYL CITRATE (PF) 100 MCG/2ML IJ SOLN
INTRAMUSCULAR | Status: DC | PRN
  Administered 2024-04-15: 15 ug via INTRATHECAL

## 2024-04-15 MED ORDER — OXYCODONE HCL 5 MG/5ML PO SOLN: 5.0000 mg | Freq: Once | ORAL | Status: DC | PRN

## 2024-04-15 MED ORDER — CEFAZOLIN SODIUM-DEXTROSE 2-4 GM/100ML-% IV SOLN: 2.0000 g | INTRAVENOUS | Status: DC

## 2024-04-15 MED ORDER — VALACYCLOVIR HCL 500 MG PO TABS
500.0000 mg | ORAL_TABLET | Freq: Two times a day (BID) | ORAL | Status: DC
  Administered 2024-04-16 – 2024-04-17 (×4): 500 mg via ORAL
  Filled 2024-04-15 (×6): qty 1

## 2024-04-15 MED ORDER — ZOLPIDEM TARTRATE 5 MG PO TABS: 5.0000 mg | ORAL_TABLET | Freq: Every evening | ORAL | Status: DC | PRN

## 2024-04-15 MED ORDER — WITCH HAZEL-GLYCERIN EX PADS: 1.0000 | MEDICATED_PAD | CUTANEOUS | Status: DC | PRN

## 2024-04-15 MED ORDER — OXYTOCIN-SODIUM CHLORIDE 30-0.9 UT/500ML-% IV SOLN: 2.5000 [IU]/h | INTRAVENOUS | Status: AC

## 2024-04-15 MED ORDER — FENTANYL CITRATE (PF) 100 MCG/2ML IJ SOLN
INTRAMUSCULAR | Status: AC
  Filled 2024-04-15: qty 2

## 2024-04-15 MED ORDER — SODIUM CHLORIDE 0.9 % IV SOLN
25.0000 mg | Freq: Once | INTRAVENOUS | Status: DC
  Filled 2024-04-15: qty 1

## 2024-04-15 MED ORDER — DEXAMETHASONE SODIUM PHOSPHATE 10 MG/ML IJ SOLN
INTRAMUSCULAR | Status: DC | PRN
  Administered 2024-04-15: 10 mg via INTRAVENOUS

## 2024-04-15 MED ORDER — PHENYLEPHRINE HCL-NACL 20-0.9 MG/250ML-% IV SOLN
INTRAVENOUS | Status: DC | PRN
  Administered 2024-04-15: 40 ug/min via INTRAVENOUS

## 2024-04-15 SURGICAL SUPPLY — 28 items
BENZOIN TINCTURE PRP APPL 2/3 (GAUZE/BANDAGES/DRESSINGS) ×2 IMPLANT
CHLORAPREP W/TINT 26 (MISCELLANEOUS) ×4 IMPLANT
CLAMP UMBILICAL CORD (MISCELLANEOUS) ×2 IMPLANT
CLOTH BEACON ORANGE TIMEOUT ST (SAFETY) ×2 IMPLANT
DERMABOND ADVANCED .7 DNX12 (GAUZE/BANDAGES/DRESSINGS) IMPLANT
DRSG OPSITE POSTOP 4X10 (GAUZE/BANDAGES/DRESSINGS) ×2 IMPLANT
ELECTRODE REM PT RTRN 9FT ADLT (ELECTROSURGICAL) ×2 IMPLANT
EXTRACTOR VACUUM KIWI (MISCELLANEOUS) IMPLANT
GLOVE BIO SURGEON STRL SZ 6 (GLOVE) ×2 IMPLANT
GLOVE BIOGEL PI IND STRL 6 (GLOVE) ×4 IMPLANT
GLOVE BIOGEL PI IND STRL 7.0 (GLOVE) ×2 IMPLANT
GOWN STRL REUS W/TWL LRG LVL3 (GOWN DISPOSABLE) ×4 IMPLANT
KIT ABG SYR 3ML LUER SLIP (SYRINGE) ×2 IMPLANT
NDL HYPO 25X5/8 SAFETYGLIDE (NEEDLE) ×2 IMPLANT
NEEDLE HYPO 25X5/8 SAFETYGLIDE (NEEDLE) ×1 IMPLANT
NS IRRIG 1000ML POUR BTL (IV SOLUTION) ×2 IMPLANT
PACK C SECTION WH (CUSTOM PROCEDURE TRAY) ×2 IMPLANT
PAD OB MATERNITY 4.3X12.25 (PERSONAL CARE ITEMS) ×2 IMPLANT
STRIP CLOSURE SKIN 1/2X4 (GAUZE/BANDAGES/DRESSINGS) IMPLANT
SUT CHROMIC 0 CTX 36 (SUTURE) ×6 IMPLANT
SUT MON AB 2-0 CT1 27 (SUTURE) ×2 IMPLANT
SUT PDS AB 0 CT1 27 (SUTURE) IMPLANT
SUT PLAIN 0 NONE (SUTURE) IMPLANT
SUT VIC AB 0 CT1 36 (SUTURE) IMPLANT
SUT VIC AB 4-0 KS 27 (SUTURE) IMPLANT
TOWEL OR 17X24 6PK STRL BLUE (TOWEL DISPOSABLE) ×2 IMPLANT
TRAY FOLEY W/BAG SLVR 14FR LF (SET/KITS/TRAYS/PACK) IMPLANT
WATER STERILE IRR 1000ML POUR (IV SOLUTION) ×2 IMPLANT

## 2024-04-15 NOTE — Transfer of Care (Signed)
 Immediate Anesthesia Transfer of Care Note  Patient: [name] [name]  Procedure(s) Performed: CESAREAN DELIVERY  Patient Location: PACU  Anesthesia Type:Spinal  Level of Consciousness: awake, alert , and oriented  Airway & Oxygen Therapy: Patient Spontanous Breathing  Post-op Assessment: Report given to RN and Post -op Vital signs reviewed and stable  Post vital signs: Reviewed and stable  Last Vitals:  Vitals Value Taken Time  BP 110/56 04/15/24 13:50  Temp    Pulse 75 04/15/24 13:53  Resp 20 04/15/24 13:53  SpO2 97 % 04/15/24 13:53  Vitals shown include unfiled device data.  Last Pain:  Vitals:   04/15/24 0819  PainSc: 7          Complications: No notable events documented.

## 2024-04-15 NOTE — H&P (Addendum)
 [name] [name] is a 33 y.o. female G3P2002 at [redacted]w[redacted]d presenting for labor.  Cervix changed from 1 to 3 cm in MAU and patient reports extreme pain with CTX coming more frequently and intensity.  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  She has been taking Valtrex  suppression but on arrival to MAU, reported painful anal lesions.  Ala Cart, CNM examined and documented two <1cm prevesicular lesions in the 4 o'clock orientation (painful to the touch).  Patient has h/o anx/dep and is not taking medications at this time.  Rt leg fx at 17 weeks and s/p ORIF.  GBS negative.  Low risk NIPT.  OB History     Gravida  3   Para  2   Term  2   Preterm  0   AB  0   Living  2      SAB  0   IAB  0   Ectopic  0   Multiple  0   Live Births  2          Past Medical History:  Diagnosis Date   Abnormal Pap smear of cervix    Anxiety    Breast feeding status of mother    Migraine    Past Surgical History:  Procedure Laterality Date   TIBIA IM NAIL INSERTION Right 11/21/2023   Procedure: INSERTION, INTRAMEDULLARY ROD, TIBIA;  Surgeon: Kendal Franky SQUIBB, MD;  Location: [facility] OR;  Service: Orthopedics;  Laterality: Right;   TONSILLECTOMY     Family History: family history includes Cancer in her maternal grandmother; Diabetes in her paternal grandmother; Endometriosis in her mother; Hypertension in her paternal grandmother; Polycystic ovary syndrome in her mother; Sleep apnea in her mother; Thyroid  disease in her paternal grandmother. Social History:  reports that she has quit smoking. Her smoking use included e-cigarettes and cigarettes. She has never been exposed to tobacco smoke. She has never used smokeless tobacco. She reports that she does not currently use alcohol. She reports that she does not currently use drugs after having used the following drugs: Marijuana.     Maternal Diabetes: No Genetic Screening: Normal Maternal Ultrasounds/Referrals:  Normal Fetal Ultrasounds or other Referrals:  None Maternal Substance Abuse:  No Significant Maternal Medications:  Meds include: Other: Valtrex  Significant Maternal Lab Results:  Group B Strep negative Number of Prenatal Visits:greater than 3 verified prenatal visits Maternal Vaccinations:TDap Other Comments:  None  Review of Systems Maternal Medical History:  Reason for admission: Contractions.   Contractions: Onset was 6-12 hours ago.   Frequency: irregular.   Perceived severity is moderate.   Fetal activity: Perceived fetal activity is normal.   Last perceived fetal movement was within the past hour.   Prenatal complications: no prenatal complications Prenatal Complications - Diabetes: none.   Dilation: 3 Effacement (%): 70, 80 Station: -2 Exam by:: Ala Cart, CNM Blood pressure 115/80, pulse 87, temperature 97.6 F (36.4 C), resp. rate 15, height 5' 3 (1.6 m), weight 117 kg, last menstrual period 07/19/2023, SpO2 98%. Maternal Exam:  Uterine Assessment: Contraction strength is moderate.  Contraction frequency is regular.  Abdomen: Patient reports no abdominal tenderness. Fundal height is c/w dates.   Estimated fetal weight is 8#8.   Introitus: See provider's note from pelvic exam; specifically, active HSV lesions on perineum   Fetal Exam Fetal Monitor Review: Baseline rate: 140.  Variability: moderate (6-25 bpm).   Pattern: accelerations present and no decelerations.   Fetal State Assessment: Category I -  tracings are normal.   Physical Exam Constitutional:      Appearance: Normal appearance.  HENT:     Head: Normocephalic and atraumatic.  Pulmonary:     Effort: Pulmonary effort is normal.  Abdominal:     Palpations: Abdomen is soft.  Musculoskeletal:        General: Normal range of motion.     Cervical back: Normal range of motion.  Skin:    General: Skin is warm and dry.  Neurological:     Mental Status: She is alert and oriented to person, place,  and time.  Psychiatric:        Mood and Affect: Mood normal.        Behavior: Behavior normal.     Prenatal labs: ABO, Rh: --/--/A POS (07/19 1152) Antibody: NEG (07/19 1152) Rubella:  Immune RPR:   NR HBsAg:   Negative HIV: Non Reactive (04/28 0706)  GBS:   Negative  Assessment/Plan: 32yo G3P2002 at [redacted]w[redacted]d with labor, active recurrent HSV outbreak -Recommend primary C/S for active HSV -Will proceed urgently given multiparity and labor -Patient is counseled re: risk of bleeding, infection, scarring and damage to surrounding structures.  She is informed of steps of procedure as well as postop expectations and limitations.  She is informed of risk of uterine rupture and abnormal placentation in future pregnancies.  All questions were answered and patient wishes to proceed.   Duwaine Blumenthal 04/15/2024, 11:57 AM

## 2024-04-15 NOTE — Op Note (Addendum)
 [name] [name] PROCEDURE DATE: 04/15/2024  PREOPERATIVE DIAGNOSIS: Intrauterine pregnancy at  [redacted]w[redacted]d weeks gestation, labor, active recurrent HSV outbreak (Partner unaware of diagnosis)  POSTOPERATIVE DIAGNOSIS: The same  PROCEDURE:  Primary Low Transverse Cesarean Section  SURGEON:  Dr. Duwaine Blumenthal  INDICATIONS: DIERRA RIESGO is a 33 y.o. G3P2002 at [redacted]w[redacted]d scheduled for cesarean section secondary to labor and active recurrent HSV outbreak.  The risks of cesarean section discussed with the patient included but were not limited to: bleeding which may require transfusion or reoperation; infection which may require antibiotics; injury to bowel, bladder, ureters or other surrounding organs; injury to the fetus; need for additional procedures including hysterectomy in the event of a life-threatening hemorrhage; placental abnormalities wth subsequent pregnancies, incisional problems, thromboembolic phenomenon and other postoperative/anesthesia complications. The patient concurred with the proposed plan, giving informed written consent for the procedure.    FINDINGS:  Viable female infant in cephalic presentation, APGARs 9,9: weight 8#11.7oz  Clear amniotic fluid.  Intact placenta, three vessel cord.  Grossly normal uterus, ovaries and fallopian tubes. .   ANESTHESIA:  Spinal ESTIMATED BLOOD LOSS: 289 ml SPECIMENS: Placenta sent to L&D COMPLICATIONS: None immediate  PROCEDURE IN DETAIL:  The patient received intravenous antibiotics and had sequential compression devices applied to her lower extremities while in the preoperative area.  She was then taken to the operating room where spinal anesthesia was administered and was found to be adequate. She was then placed in a dorsal supine position with a leftward tilt.  A foley catheter was placed into her bladder and attached to constant gravity. I assisted with foley insertion and visualized peri-anal HSV lesions. Patient was prepped and draped in a  sterile manner.   After an adequate timeout was performed, a Pfannenstiel skin incision was made with scalpel and carried through to the underlying layer of fascia. The fascia was incised in the midline and this incision was extended bilaterally using the Mayo scissors. Kocher clamps were applied to the superior aspect of the fascial incision and the underlying rectus muscles were dissected off bluntly. A similar process was carried out on the inferior aspect of the facial incision. The rectus muscles were separated in the midline bluntly and the peritoneum was entered bluntly.  Bladder flap was created sharply and developed bluntly.  Bladder blade was placed.  A transverse hysterotomy was made with a scalpel and extended bilaterally bluntly. The bladder blade was then removed. The infant was successfully delivered, and cord was clamped and cut and infant was handed over to awaiting neonatology team. Uterine massage was then administered and the placenta delivered intact with three-vessel cord. The uterus was cleared of clot and debris.  The hysterotomy was closed with 0 chromic.  A second imbricating suture of 0-chromic was used to reinforce the incision and aid in hemostasis.  The peritoneum and rectus muscles were noted to be hemostatic and were reapproximated using 2-0 monocryl in a running fashion.  The fascia was closed with 0-Vicryl in a running fashion with good restoration of anatomy.  The subcutaneus tissue was copiously irrigated.  The skin was closed with 4-0 vicryl in a subcuticular fashion.  Pt tolerated the procedure will.  All counts were correct x2.  Pt went to the recovery room in stable condition.

## 2024-04-15 NOTE — Plan of Care (Signed)
  Problem: Education: Goal: Knowledge of General Education information will improve Description: Including pain rating scale, medication(s)/side effects and non-pharmacologic comfort measures 04/15/2024 1537 by Madison Rosina LABOR, LPN Outcome: Progressing 04/15/2024 1537 by Madison Rosina LABOR, LPN Outcome: Progressing   Problem: Health Behavior/Discharge Planning: Goal: Ability to manage health-related needs will improve 04/15/2024 1537 by Madison Rosina LABOR, LPN Outcome: Progressing 04/15/2024 1537 by Madison Rosina LABOR, LPN Outcome: Progressing   Problem: Clinical Measurements: Goal: Ability to maintain clinical measurements within normal limits will improve 04/15/2024 1537 by Madison Rosina LABOR, LPN Outcome: Progressing 04/15/2024 1537 by Madison Rosina LABOR, LPN Outcome: Progressing Goal: Will remain free from infection 04/15/2024 1537 by Madison Rosina LABOR, LPN Outcome: Progressing 04/15/2024 1537 by Madison Rosina LABOR, LPN Outcome: Progressing Goal: Diagnostic test results will improve 04/15/2024 1537 by Madison Rosina LABOR, LPN Outcome: Progressing 04/15/2024 1537 by Madison Rosina LABOR, LPN Outcome: Progressing Goal: Respiratory complications will improve 04/15/2024 1537 by Madison Rosina LABOR, LPN Outcome: Progressing 04/15/2024 1537 by Madison Rosina LABOR, LPN Outcome: Progressing Goal: Cardiovascular complication will be avoided 04/15/2024 1537 by Madison Rosina LABOR, LPN Outcome: Progressing 04/15/2024 1537 by Madison Rosina LABOR, LPN Outcome: Progressing   Problem: Activity: Goal: Risk for activity intolerance will decrease 04/15/2024 1537 by Madison Rosina LABOR, LPN Outcome: Progressing 04/15/2024 1537 by Madison Rosina LABOR, LPN Outcome: Progressing   Problem: Nutrition: Goal: Adequate nutrition will be maintained 04/15/2024 1537 by Madison Rosina LABOR, LPN Outcome: Progressing 04/15/2024 1537 by Madison Rosina LABOR, LPN Outcome: Progressing   Problem: Coping: Goal: Level of anxiety will decrease 04/15/2024 1537 by Madison Rosina LABOR, LPN Outcome:  Progressing 04/15/2024 1537 by Madison Rosina LABOR, LPN Outcome: Progressing   Problem: Elimination: Goal: Will not experience complications related to bowel motility 04/15/2024 1537 by Madison Rosina LABOR, LPN Outcome: Progressing 04/15/2024 1537 by Madison Rosina LABOR, LPN Outcome: Progressing Goal: Will not experience complications related to urinary retention 04/15/2024 1537 by Madison Rosina LABOR, LPN Outcome: Progressing 04/15/2024 1537 by Madison Rosina LABOR, LPN Outcome: Progressing   Problem: Pain Managment: Goal: General experience of comfort will improve and/or be controlled Outcome: Progressing   Problem: Safety: Goal: Ability to remain free from injury will improve Outcome: Progressing   Problem: Skin Integrity: Goal: Risk for impaired skin integrity will decrease Outcome: Progressing   Problem: Education: Goal: Knowledge of the prescribed therapeutic regimen will improve Outcome: Progressing Goal: Understanding of sexual limitations or changes related to disease process or condition will improve Outcome: Progressing Goal: Individualized Educational Video(s) Outcome: Progressing   Problem: Self-Concept: Goal: Communication of feelings regarding changes in body function or appearance will improve Outcome: Progressing   Problem: Skin Integrity: Goal: Demonstration of wound healing without infection will improve Outcome: Progressing   Problem: Education: Goal: Knowledge of condition will improve Outcome: Progressing Goal: Individualized Educational Video(s) Outcome: Progressing Goal: Individualized Newborn Educational Video(s) Outcome: Progressing   Problem: Activity: Goal: Will verbalize the importance of balancing activity with adequate rest periods Outcome: Progressing Goal: Ability to tolerate increased activity will improve Outcome: Progressing   Problem: Coping: Goal: Ability to identify and utilize available resources and services will improve Outcome: Progressing    Problem: Life Cycle: Goal: Chance of risk for complications during the postpartum period will decrease Outcome: Progressing   Problem: Role Relationship: Goal: Ability to demonstrate positive interaction with newborn will improve Outcome: Progressing   Problem: Skin Integrity: Goal: Demonstration of wound healing without infection will improve Outcome: Progressing

## 2024-04-15 NOTE — MAU Note (Signed)
.  [name] [name] is a 33 y.o. at [redacted]w[redacted]d here in MAU reporting: Patient reports ctx that started at 0300. She reports they are not regular, but are getting more regular and are intense. 7/10 abdomen   Onset of complaint: today 0300 Pain score: 7/10 There were no vitals filed for this visit.    Lab orders placed from triage:   none

## 2024-04-15 NOTE — Anesthesia Preprocedure Evaluation (Addendum)
 Anesthesia Evaluation  Patient identified by MRN, date of birth, ID band Patient awake    Reviewed: Allergy & Precautions, NPO status , Patient's Chart, lab work & pertinent test results  Airway Mallampati: III       Dental no notable dental hx.    Pulmonary former smoker   Pulmonary exam normal        Cardiovascular negative cardio ROS Normal cardiovascular exam     Neuro/Psych  Headaches PSYCHIATRIC DISORDERS Anxiety Depression       GI/Hepatic negative GI ROS, Neg liver ROS,,,  Endo/Other    Class 3 obesity  Renal/GU negative Renal ROS     Musculoskeletal negative musculoskeletal ROS (+)    Abdominal  (+) + obese  Peds  Hematology negative hematology ROS (+)   Anesthesia Other Findings Active labor with HSV  Reproductive/Obstetrics                              Anesthesia Physical Anesthesia Plan  ASA: 3 and emergent  Anesthesia Plan: Spinal   Post-op Pain Management:    Induction:   PONV Risk Score and Plan: 2 and Ondansetron , Dexamethasone  and Treatment may vary due to age or medical condition  Airway Management Planned: Natural Airway  Additional Equipment:   Intra-op Plan:   Post-operative Plan:   Informed Consent: I have reviewed the patients History and Physical, chart, labs and discussed the procedure including the risks, benefits and alternatives for the proposed anesthesia with the patient or authorized representative who has indicated his/her understanding and acceptance.     Dental advisory given  Plan Discussed with: CRNA and Surgeon  Anesthesia Plan Comments:          Anesthesia Quick Evaluation

## 2024-04-15 NOTE — Anesthesia Procedure Notes (Signed)
 Spinal  Patient location during procedure: OR Start time: 04/15/2024 12:25 PM End time: 04/15/2024 12:30 PM Reason for block: surgical anesthesia Staffing Performed: anesthesiologist  Anesthesiologist: Patrisha Bernardino SQUIBB, MD Performed by: Patrisha Bernardino SQUIBB, MD Authorized by: Patrisha Bernardino SQUIBB, MD   Preanesthetic Checklist Completed: patient identified, IV checked, risks and benefits discussed, surgical consent, monitors and equipment checked, pre-op evaluation and timeout performed Spinal Block Patient position: sitting Prep: DuraPrep Patient monitoring: cardiac monitor, continuous pulse ox and blood pressure Approach: midline Location: L4-5 Injection technique: single-shot Needle Needle type: Pencan  Needle gauge: 24 G Needle length: 9 cm Assessment Sensory level: T10 Events: CSF return Additional Notes Functioning IV was confirmed and monitors were applied. Sterile prep and drape, including hand hygiene and sterile gloves were used. The patient was positioned and the spine was prepped. The skin was anesthetized with lidocaine .  Free flow of clear CSF was obtained prior to injecting local anesthetic into the CSF.  The spinal needle aspirated freely following injection.  The needle was carefully withdrawn.  The patient tolerated the procedure well.

## 2024-04-15 NOTE — MAU Provider Note (Cosign Needed Addendum)
 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 She presents with complaints of being uncomfortable that began this morning around 3am and an indescribable anal sensation. She attributes this to her irregular contractions that she has been experiencing since 3am. She did include that her contractions seem to be more regular since coming to the MAU. She rates her pain a 7 out of 10 on the pain scale that is accompanied with nausea. She describes this pain to be uncomfortable. Also, of note, she mentions more swelling in her upper and lower extremities. She denies headache, vision changes, CP, SOB, and/or RUQ.    Pregnancy Course: Receives care at [facility]. Prenatal records reviewed.   Past Medical History:  Diagnosis Date   Abnormal Pap smear of cervix    Anxiety    Breast feeding status of mother    Migraine    OB History  Gravida Para Term Preterm AB Living  3 2 2  0 0 2  SAB IAB Ectopic Multiple Live Births  0 0 0 0 2    # Outcome Date GA Lbr Len/2nd Weight Sex Type Anes PTL Lv  3 Current           2 Term 10/04/13 [redacted]w[redacted]d 17:56 / 00:10 3495 g F Vag-Spont EPI  LIV  1 Term 08/13/11 [redacted]w[redacted]d  4338 g F Vag-Spont None  LIV     Birth Comments: LGA 9#9oz, no GDM or SD. Vacuum assist, episiotomy   Past Surgical History:  Procedure Laterality Date   TIBIA IM NAIL INSERTION Right 11/21/2023   Procedure: INSERTION, INTRAMEDULLARY ROD, TIBIA;  Surgeon: Kendal Franky SQUIBB, MD;  Location: [facility] OR;  Service: Orthopedics;  Laterality: Right;   TONSILLECTOMY     Family History  Problem Relation Age of Onset   Polycystic ovary syndrome Mother    Endometriosis Mother    Sleep apnea Mother    Cancer Maternal Grandmother        uterine   Hypertension Paternal Grandmother    Diabetes Paternal Grandmother    Thyroid  disease  Paternal Grandmother    Social History   Tobacco Use   Smoking status: Former    Types: E-cigarettes, Cigarettes    Passive exposure: Never   Smokeless tobacco: Never  Vaping Use   Vaping status: Former  Substance Use Topics   Alcohol use: Not Currently    Comment: occ   Drug use: Not Currently    Types: Marijuana   No Known Allergies Medications Prior to Admission  Medication Sig Dispense Refill Last Dose/Taking   valACYclovir  (VALTREX ) 500 MG tablet Take 1 tablet (500 mg total) by mouth 2 (two) times daily. 6 tablet 0 Past Month   acetaminophen  (TYLENOL ) 500 MG tablet Take 2 tablets (1,000 mg total) by mouth every 6 (six) hours as needed for mild pain (pain score 1-3), fever or headache.      aspirin  EC 81 MG tablet Take 2 tablets (162 mg total) by mouth daily. Swallow whole. 30 tablet 12    ferrous sulfate  325 (65 FE) MG tablet Take 325 mg by mouth daily with breakfast.      loratadine  (CLARITIN ) 10 MG tablet Take 1 tablet (10 mg total) by mouth daily. 30 tablet 0    oxyCODONE  (OXY IR/ROXICODONE ) 5 MG immediate release tablet Take  1-2 tablets (5-10 mg total) by mouth every 4 (four) hours as needed for moderate pain (pain score 4-6) or severe pain (pain score 7-10). 42 tablet 0    Prenatal MV & Min w/FA-DHA (PRENATAL GUMMIES PO) Take 2 tablets by mouth daily.       I have reviewed patient's Past Medical Hx, Surgical Hx, Family Hx, Social Hx, medications and allergies.   ROS  Pertinent items noted in HPI and remainder of comprehensive ROS otherwise negative.   PHYSICAL EXAM  Patient Vitals for the past 24 hrs:  BP Temp Pulse Resp SpO2  04/15/24 0905 115/80 -- 87 -- 98 %  04/15/24 0846 127/84 -- 88 -- --  04/15/24 0843 136/81 -- 82 -- --  04/15/24 0820 (!) 157/88 97.6 F (36.4 C) 87 15 99 %    Physical Exam Constitutional: Well-developed, well-nourished female in no acute distress.  HEENT: atraumatic, normocephalic. Neck has normal ROM. EOM intact. Cardiovascular:  normal rate & rhythm, warm and well-perfused. Swelling noted in upper and lower extremities, 1+ in the lower and trace in the upper.  Respiratory: normal effort, no problems with respiration noted GI: Abd soft, non-tender, non-distended. Anus: two <1cm prevesicular lesions in the 4 o'clock orientation (painful to the touch). MSK: Extremities nontender, no edema, normal ROM Skin: warm and dry. Acyanotic, no jaundice or pallor. Neurologic: Alert and oriented x 4. No abnormal coordination. Psychiatric: Normal mood. Speech not slurred, not rapid/pressured. Patient is cooperative. GU: no CVA tenderness Pelvic exam: VULVA: normal appearing vulva with no masses, tenderness or lesions, VAGINA: normal appearing vagina with normal color and discharge, no lesions, CERVIX: per cervical exam below.  Exam chaperoned by Levon Budd, RN.   Dilation: 3 Effacement (%): 70, 80 Cervical Position: Middle Station: -2 Presentation: Vertex Exam by:: Ala Cart, CNM  Reactive NST: Baseline FHR: 130  Fetal heart variability: moderate Fetal Heart Rate accelerations: yes Fetal Heart Rate decelerations: none since interventions in beginning of care Toco: regular every 2-4 mins   Labs: Results for orders placed or performed during the hospital encounter of 04/15/24 (from the past 24 hours)  CBC     Status: Abnormal   Collection Time: 04/15/24  9:00 AM  Result Value Ref Range   WBC 10.7 (H) 4.0 - 10.5 K/uL   RBC 3.98 3.87 - 5.11 MIL/uL   Hemoglobin 11.9 (L) 12.0 - 15.0 g/dL   HCT 63.6 63.9 - 53.9 %   MCV 91.2 80.0 - 100.0 fL   MCH 29.9 26.0 - 34.0 pg   MCHC 32.8 30.0 - 36.0 g/dL   RDW 84.9 88.4 - 84.4 %   Platelets 232 150 - 400 K/uL   nRBC 0.0 0.0 - 0.2 %  Comprehensive metabolic panel with GFR     Status: Abnormal   Collection Time: 04/15/24  9:00 AM  Result Value Ref Range   Sodium 137 135 - 145 mmol/L   Potassium 4.0 3.5 - 5.1 mmol/L   Chloride 107 98 - 111 mmol/L   CO2 18 (L) 22 - 32  mmol/L   Glucose, Bld 86 70 - 99 mg/dL   BUN 7 6 - 20 mg/dL   Creatinine, Ser 9.41 0.44 - 1.00 mg/dL   Calcium  8.8 (L) 8.9 - 10.3 mg/dL   Total Protein 6.1 (L) 6.5 - 8.1 g/dL   Albumin 2.7 (L) 3.5 - 5.0 g/dL   AST 19 15 - 41 U/L   ALT 16 0 - 44 U/L   Alkaline Phosphatase 92 38 -  126 U/L   Total Bilirubin 0.4 0.0 - 1.2 mg/dL   GFR, Estimated >39 >39 mL/min   Anion gap 12 5 - 15  Protein / creatinine ratio, urine     Status: None   Collection Time: 04/15/24  9:00 AM  Result Value Ref Range   Creatinine, Urine 44 mg/dL   Total Protein, Urine <6 mg/dL   Protein Creatinine Ratio RESULT OUTSIDE REPORTABLE RANGE,  UNABLE TO CALCULATE.       0.00 - 0.15 mg/mg[Cre]    MDM & MAU COURSE  MDM: Moderate  MAU Course:  Orders Placed This Encounter  Procedures   CBC   Comprehensive metabolic panel with GFR   Protein / creatinine ratio, urine   Meds ordered this encounter  Medications   promethazine  (PHENERGAN ) 25 mg in sodium chloride  0.9 % 50 mL IVPB   lactated ringers  bolus 1,000 mL   *Patient declined Phenergan   Lytle Pizza, PA-S 04/15/2024 9:02 AM  Note assumed from PA-S prior to admission by R. Letha, CNM ASSESSMENT   1. Herpes virus infection in mother during third trimester of pregnancy   2. Indication for care in labor or delivery   3. [redacted] weeks gestation of pregnancy    *TC to Dr. Dannielle @ 1131 - notified of patient's complaints, assessments, lab, physical exam  with finding of 2 suspicious HSV lesions in anal area, NST results, and NPO status, tx plan proceed with primary C/S - agrees with plan, will come in to consent patient for C/S PLAN  - Prepare for the OR - Dr. Dannielle assumes care of patient  - See Dr. Dannielle' H&P documentation   Attestation of Supervision of Student:  I confirm that I have verified the information documented in the physician assistant student's note and that I have also personally reperformed the history, physical exam and all medical decision  making activities.  I have verified that all services and findings are accurately documented in this student's note; and I agree with management and plan as outlined in the documentation. I have also made any necessary editorial changes.  Additions to note added above by this CNM as documented. NPO status: ate a banana at 0730 and drank water  at 0930.  Loran Auguste, CNM [facility] [employer], [facility] 04/15/2024 11:52 AM

## 2024-04-16 ENCOUNTER — Encounter (HOSPITAL_COMMUNITY): Payer: Self-pay | Admitting: Obstetrics & Gynecology

## 2024-04-16 LAB — RPR: RPR Ser Ql: NONREACTIVE

## 2024-04-16 LAB — CBC
HCT: 31.2 % — ABNORMAL LOW (ref 36.0–46.0)
Hemoglobin: 10.5 g/dL — ABNORMAL LOW (ref 12.0–15.0)
MCH: 29.9 pg (ref 26.0–34.0)
MCHC: 33.7 g/dL (ref 30.0–36.0)
MCV: 88.9 fL (ref 80.0–100.0)
Platelets: 216 K/uL (ref 150–400)
RBC: 3.51 MIL/uL — ABNORMAL LOW (ref 3.87–5.11)
RDW: 15.1 % (ref 11.5–15.5)
WBC: 18.9 K/uL — ABNORMAL HIGH (ref 4.0–10.5)
nRBC: 0 % (ref 0.0–0.2)

## 2024-04-16 NOTE — Lactation Note (Signed)
 This note was copied from a baby's chart. Lactation Consultation Note  Patient Name: [name] [name] Date: 04/16/2024 Age:34 hours Reason for consult: Follow-up assessment;Early term 37-38.6wks;Infant weight loss (4 % weight loss) Per mom the baby is latching well and its comfortable. Swallows noted. It's only been  2 1/2 hours since the baby last fed. Breast fed at that time for 15 mins. Plenty of wets and stools, which correlates with the weight loss.  Mom aware she can call for latch assessment.   Maternal Data Does the patient have breastfeeding experience prior to this delivery?: Yes How long did the patient breastfeed?: per mom 1st baby 2 weeks, 2 baby 6-7 months and when she went back to work switched to formula , no place  to pump.  Feeding Mother's Current Feeding Choice: Breast Milk  LATCH Score -  last score 8     Lactation Tools Discussed/Used  None needed as of yet   Interventions  Education Storage of breast milk and cleaning of pump.  Lactation resource sheet   Discharge Pump: DEBP;Personal  Consult Status Consult Status: Follow-up Date: 04/17/24 Follow-up type: In-patient    [name] [name] 04/16/2024, 4:45 PM

## 2024-04-16 NOTE — Anesthesia Postprocedure Evaluation (Signed)
 Anesthesia Post Note  Patient: [name] [name]  Procedure(s) Performed: CESAREAN DELIVERY     Patient location during evaluation: PACU Anesthesia Type: Spinal Level of consciousness: awake Pain management: pain level controlled Vital Signs Assessment: post-procedure vital signs reviewed and stable Respiratory status: spontaneous breathing, nonlabored ventilation and respiratory function stable Cardiovascular status: blood pressure returned to baseline and stable Postop Assessment: no apparent nausea or vomiting Anesthetic complications: no   No notable events documented.  Last Vitals:  Vitals:   04/16/24 0205 04/16/24 0557  BP: 105/62 111/65  Pulse: 72 70  Resp: 18 18  Temp: 36.7 C 37 C  SpO2: 95% 98%    Last Pain:  Vitals:   04/16/24 0557  TempSrc: Oral  PainSc:    Pain Goal:                   Bernardino SHAUNNA Mango

## 2024-04-16 NOTE — Progress Notes (Signed)
 Subjective: Postpartum Day 1: Cesarean Delivery Patient reports tolerating PO and no problems voiding.    Objective: Vital signs in last 24 hours: Temp:  [97.6 F (36.4 C)-98.6 F (37 C)] 98.6 F (37 C) (09/22 0557) Pulse Rate:  [62-88] 70 (09/22 0557) Resp:  [13-19] 18 (09/22 0557) BP: (105-157)/(56-88) 111/65 (09/22 0557) SpO2:  [95 %-100 %] 98 % (09/22 0557) Weight:  [882 kg] 117 kg (09/21 1157)  Physical Exam:  General: alert, cooperative, and no distress Lochia: appropriate Uterine Fundus: firm Incision: healing well DVT Evaluation: No evidence of DVT seen on physical exam.  Recent Labs    04/15/24 0900 04/16/24 0438  HGB 11.9* 10.5*  HCT 36.3 31.2*    Assessment/Plan: Status post Cesarean section. Doing well postoperatively.  Continue current care.  Lynwood FORBES Clubs II, MD 04/16/2024, 7:40 AM

## 2024-04-17 NOTE — Progress Notes (Signed)
 CSW received a consult for Pt had a primary dx for active genital HSV and FOB does not know. Pt was very worried during Pre-op setting. CSW met MOB at bedside to complete a mental health assessment and offer support. CSW entered the room, introduced herself and acknowledged that FOB was present. CSW asked MOB for privacy reasons could FOB stepout for the assessment; MOB was agreeable and FOB stepped out. CSW explained her role and the reason for the visit. MOB was polite, easy to engage, receptive to meeting with CSW, and appeared forthcoming.  CSW asked MOB about her reservations towards FOB knowing about the HSV diagnoses. MOB reported her high school sweetheartis the reason for HSV diagnoses and she has lived in guilt overtime due to the diagnoses. MOB reported FOB currently does not know about her diagnoses and plans to tell him when the time is appropriate. CSW validated MOB's feelings and encouraged her to tell FOB when the time is right. CSW inquired about MOB's mental health history. MOB reported experiencing anxiety and depression prior to her pregnancies. MOB reported participating in therapy in the past however the sessions were not supportive due to the therapist. CSW provided [place]. MOB reported being prescribed medication 2 years and found the support helpful. 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 MOB reported the PP rage symptoms lasting until she left her daughters father in 2016-2017. MOB reported overtime she has handled her mental health with talking through her feelings with FOB and becoming self aware. MOB reported her supports as her mom, FOB and FOB parents. CSW provided education regarding the baby blues period vs. perinatal mood disorders, discussed treatment and gave resources for mental health follow up if  concerns arise.  CSW recommends self-evaluation during the postpartum time period using the New Mom Checklist from Postpartum Progress and encouraged MOB to contact a medical professional if symptoms are noted at any time.  CSW assessed for safety with MOB SI/HI/DV;MOB denied all.  CSW asked MOB has she selected a pediatrician for the infant's follow up visits; MOB said [facility].  MOB reported having all essential items for the infant including a carseat, bassinet and crib for safe sleeping. CSW provided review of Sudden Infant Death Syndrome (SIDS) precautions.  CSW identifies no further need for intervention and no barriers to discharge at this time.  Rosina Molt, [place] Clinical Social Worker [phone]

## 2024-04-17 NOTE — Discharge Summary (Signed)
 Postpartum Discharge Summary  Date of Service April 17, 2024     Patient Name: [name] [name] DOB: Nov 26, 1990 MRN: [id number]  Date of admission: 04/15/2024 Delivery date:04/15/2024 Delivering provider: DANNIELLE BOUCHARD Date of discharge: 04/17/2024  Admitting diagnosis: Cesarean delivery, delivered, current hospitalization [O82] Maternal active herpes simplex virus (HSV), delivered, current hospitalization [O98.52, B00.9] S/P cesarean section [Z98.891] Intrauterine pregnancy: [redacted]w[redacted]d     Secondary diagnosis:  Principal Problem:   Cesarean delivery, delivered, current hospitalization Active Problems:   Maternal active herpes simplex virus (HSV), delivered, current hospitalization   S/P cesarean section  Additional problems: none     Discharge diagnosis: Term Pregnancy Delivered                                              Post partum procedures:none  Augmentation: N/A Complications: None  Hospital course: Onset of Labor With Unplanned C/S   33 y.o. yo G3P3003 at [redacted]w[redacted]d was admitted in Active Labor on 04/15/2024. Patient had a labor course significant for  HSV. The patient went for cesarean section due to Active HSV. Delivery details as follows: Membrane Rupture Time/Date: 12:55 PM,04/15/2024  Delivery Method:C-Section, Low Transverse Operative Delivery:N/A Details of operation can be found in separate operative note. Patient had a postpartum course complicated by nothing .  She is ambulating,tolerating a regular diet, passing flatus, and urinating well.  Patient is discharged home in stable condition 04/17/24.  Newborn Data: Birth date:04/15/2024 Birth time:12:56 PM Gender:Female Living status:Living Apgars:9 ,9  Weight:3960 g  Magnesium Sulfate received: No BMZ received: No Rhophylac:N/A MMR:N/A T-DaP:Given prenatally Flu: N/A RSV Vaccine received: No Transfusion:No Immunizations administered: Immunization History  Administered Date(s) Administered    Influenza,inj,Quad PF,6+ Mos 05/08/2013, 07/15/2022   Tdap 10/05/2013    Physical exam  Vitals:   04/16/24 0557 04/16/24 1414 04/16/24 2308 04/17/24 0518  BP: 111/65 126/77 126/76 136/81  Pulse: 70 72 78 89  Resp: 18 18 18 18   Temp: 98.6 F (37 C) 98.3 F (36.8 C) 98 F (36.7 C) 98 F (36.7 C)  TempSrc: Oral Oral Oral   SpO2: 98% 99% 100% 99%  Weight:      Height:       General: alert, cooperative, and no distress Lochia: appropriate Uterine Fundus: firm Incision: Healing well with no significant drainage DVT Evaluation: No evidence of DVT seen on physical exam. Labs: Lab Results  Component Value Date   WBC 18.9 (H) 04/16/2024   HGB 10.5 (L) 04/16/2024   HCT 31.2 (L) 04/16/2024   MCV 88.9 04/16/2024   PLT 216 04/16/2024      Latest Ref Rng & Units 04/15/2024    9:00 AM  CMP  Glucose 70 - 99 mg/dL 86   BUN 6 - 20 mg/dL 7   Creatinine 9.55 - 8.99 mg/dL 9.41   Sodium 864 - 854 mmol/L 137   Potassium 3.5 - 5.1 mmol/L 4.0   Chloride 98 - 111 mmol/L 107   CO2 22 - 32 mmol/L 18   Calcium  8.9 - 10.3 mg/dL 8.8   Total Protein 6.5 - 8.1 g/dL 6.1   Total Bilirubin 0.0 - 1.2 mg/dL 0.4   Alkaline Phos 38 - 126 U/L 92   AST 15 - 41 U/L 19   ALT 0 - 44 U/L 16    Edinburgh Score:    04/15/2024  5:00 PM  Edinburgh Postnatal Depression Scale Screening Tool  I have been able to laugh and see the funny side of things. 0  I have looked forward with enjoyment to things. 0  I have blamed myself unnecessarily when things went wrong. 2  I have been anxious or worried for no good reason. 1  I have felt scared or panicky for no good reason. 0  Things have been getting on top of me. 1  I have been so unhappy that I have had difficulty sleeping. 0  I have felt sad or miserable. 0  I have been so unhappy that I have been crying. 1  The thought of harming myself has occurred to me. 0  Edinburgh Postnatal Depression Scale Total 5      After visit meds:  Allergies as of  04/17/2024   No Known Allergies      Medication List     STOP taking these medications    acetaminophen  500 MG tablet Commonly known as: TYLENOL    ferrous sulfate  325 (65 FE) MG tablet   loratadine  10 MG tablet Commonly known as: CLARITIN    oxyCODONE  5 MG immediate release tablet Commonly known as: Oxy IR/ROXICODONE    PRENATAL GUMMIES PO       TAKE these medications    aspirin  EC 81 MG tablet Take 2 tablets (162 mg total) by mouth daily. Swallow whole.   valACYclovir  500 MG tablet Commonly known as: Valtrex  Take 1 tablet (500 mg total) by mouth 2 (two) times daily.         Discharge home in stable condition Infant Feeding: Breast Infant Disposition:home with mother Discharge instruction: per After Visit Summary and Postpartum booklet. Activity: Advance as tolerated. Pelvic rest for 6 weeks.  Diet: routine diet Anticipated Birth Control: Unsure Postpartum Appointment:6 weeks Additional Postpartum F/U: none Future Appointments:No future appointments. Follow up Visit:      04/17/2024 Rosaline LITTIE Cobble, MD

## 2024-04-26 ENCOUNTER — Telehealth (HOSPITAL_COMMUNITY): Payer: Self-pay | Admitting: *Deleted

## 2024-04-26 NOTE — Telephone Encounter (Signed)
 04/26/2024  Name: [name] [name] MRN: [id number] DOB: November 12, 1990  Reason for Call:  Transition of Care Hospital Discharge Call  Contact Status: Patient Contact Status: Message  Language assistant needed:          Follow-Up Questions:    Van Postnatal Depression Scale:  In the Past 7 Days:    PHQ2-9 Depression Scale:     Discharge Follow-up:    Post-discharge interventions: NA  Mliss Sieve, RN 04/26/2024 11:32

## 2024-05-01 ENCOUNTER — Inpatient Hospital Stay (HOSPITAL_COMMUNITY): Admission: RE | Admit: 2024-05-01 | Source: Home / Self Care | Admitting: Obstetrics and Gynecology

## 2024-05-01 ENCOUNTER — Inpatient Hospital Stay (HOSPITAL_COMMUNITY)

## 2024-05-28 DIAGNOSIS — Z1389 Encounter for screening for other disorder: Secondary | ICD-10-CM | POA: Diagnosis not present

## 2024-06-04 ENCOUNTER — Other Ambulatory Visit: Payer: Self-pay

## 2024-06-04 ENCOUNTER — Ambulatory Visit
Admission: RE | Admit: 2024-06-04 | Discharge: 2024-06-04 | Disposition: A | Discharge status: Home / Self Care | Source: Ambulatory Visit | Attending: Emergency Medicine | Admitting: Emergency Medicine

## 2024-06-04 VITALS — BP 114/78 | HR 78 | Temp 98.1°F | Resp 18 | Ht 63.0 in | Wt 230.0 lb

## 2024-06-04 DIAGNOSIS — H0015 Chalazion left lower eyelid: Secondary | ICD-10-CM | POA: Diagnosis not present

## 2024-06-04 DIAGNOSIS — H01005 Unspecified blepharitis left lower eyelid: Secondary | ICD-10-CM | POA: Diagnosis not present

## 2024-06-04 MED ORDER — ERYTHROMYCIN 5 MG/GM OP OINT
TOPICAL_OINTMENT | OPHTHALMIC | 0 refills | Status: AC
Start: 2024-06-04 — End: ?

## 2024-06-04 NOTE — ED Provider Notes (Signed)
 [facility]    CSN: [id number] Arrival date & time: 06/04/24  1022      History   Chief Complaint Chief Complaint  Patient presents with   Eye Problem    possible pink eye, need diagnosis - Entered by patient    HPI [name] [name] is a 33 y.o. female.  2 day history of eyelid symptoms  Left lower lid, swelling, and sore to touch (rates 1/10) Eyelid is also itching Denies discharge or drainage. No vision changes  No interventions yet    Past Medical History:  Diagnosis Date   Abnormal Pap smear of cervix    Anxiety    Breast feeding status of mother    Migraine     Patient Active Problem List   Diagnosis Date Noted   Cesarean delivery, delivered, current hospitalization 04/15/2024   Maternal active herpes simplex virus (HSV), delivered, current hospitalization 04/15/2024   S/P cesarean section 04/15/2024   Tibia/fibula fracture, right, closed, initial encounter 11/23/2023   Tibia/fibula fracture 11/21/2023   Anemia 11/21/2023   Tibial fracture 11/21/2023   Leucocytosis 11/21/2023   [redacted] weeks gestation of pregnancy 11/21/2023   Anxiety and depression 07/15/2022   Palpitations 07/15/2022   Obesity (BMI 30-39.9) 07/15/2022    Past Surgical History:  Procedure Laterality Date   CESAREAN SECTION N/A 04/15/2024   Procedure: CESAREAN DELIVERY;  Surgeon: Dannielle Bouchard, DO;  Location: [facility];  Service: Obstetrics;  Laterality: N/A;   TIBIA IM NAIL INSERTION Right 11/21/2023   Procedure: INSERTION, INTRAMEDULLARY ROD, TIBIA;  Surgeon: Kendal Franky SQUIBB, MD;  Location: [facility] OR;  Service: Orthopedics;  Laterality: Right;   TONSILLECTOMY      OB History     Gravida  3   Para  3   Term  3   Preterm  0   AB  0   Living  3      SAB  0   IAB  0   Ectopic  0   Multiple  0   Live Births  3            Home Medications    Prior to Admission medications   Medication Sig Start Date End Date Taking? Authorizing Provider  erythromycin  ophthalmic ointment Place a 1/2 inch ribbon of ointment into the left lower eyelid nightly for 5 nights 06/04/24  Yes Karron Alvizo, PA-C  valACYclovir  (VALTREX ) 500 MG tablet Take 1 tablet (500 mg total) by mouth 2 (two) times daily. 02/11/24   Jhonny Augustin BROCKS, MD    Family History Family History  Problem Relation Age of Onset   Polycystic ovary syndrome Mother    Endometriosis Mother    Sleep apnea Mother    Cancer Maternal Grandmother        uterine   Hypertension Paternal Grandmother    Diabetes Paternal Grandmother    Thyroid  disease Paternal Grandmother     Social History Social History   Tobacco Use   Smoking status: Former    Types: E-cigarettes, Cigarettes    Passive exposure: Never   Smokeless tobacco: Never  Vaping Use   Vaping status: Former  Substance Use Topics   Alcohol use: Not Currently    Comment: occ   Drug use: Not Currently    Types: Marijuana     Allergies   Patient has no known allergies.   Review of Systems Review of Systems As per HPI  Physical Exam Triage Vital Signs ED Triage Vitals  Encounter  Vitals Group     BP 06/04/24 1046 114/78     Girls Systolic BP Percentile --      Girls Diastolic BP Percentile --      Boys Systolic BP Percentile --      Boys Diastolic BP Percentile --      Pulse Rate 06/04/24 1046 78     Resp 06/04/24 1046 18     Temp 06/04/24 1046 98.1 F (36.7 C)     Temp Source 06/04/24 1046 Oral     SpO2 06/04/24 1046 95 %     Weight --      Height --      Head Circumference --      Peak Flow --      Pain Score 06/04/24 1047 1     Pain Loc --      Pain Education --      Exclude from Growth Chart --    No data found.  Updated Vital Signs BP 114/78 (BP Location: Right Arm)   Pulse 78   Temp 98.1 F (36.7 C) (Oral)   Resp 18   Ht 5' 3 (1.6 m)   Wt 230 lb (104.3 kg)   LMP 07/19/2023 (Exact Date)   SpO2 95%   Breastfeeding Yes   BMI 40.74 kg/m   Visual Acuity Right Eye Distance:   Left Eye  Distance:   Bilateral Distance:    Right Eye Near:   Left Eye Near:    Bilateral Near:     Physical Exam Vitals and nursing note reviewed.  Constitutional:      General: She is not in acute distress.    Appearance: She is not ill-appearing.  Eyes:     General: Lids are everted, no foreign bodies appreciated. Vision grossly intact.        Left eye: No foreign body, discharge or hordeolum.     Extraocular Movements: Extraocular movements intact.     Pupils: Pupils are equal, round, and reactive to light.     Comments: Minimal swelling of left lower lid, without erythema or tenderness. No hordeolum. No foreign body. EOM intact without pain. Conjunctiva normal. Laterally the lash line has minimal amount of flaking  Cardiovascular:     Rate and Rhythm: Normal rate and regular rhythm.  Pulmonary:     Effort: Pulmonary effort is normal.  Musculoskeletal:     Cervical back: Normal range of motion.  Skin:    General: Skin is warm and dry.  Neurological:     Mental Status: She is alert and oriented to person, place, and time.      UC Treatments / Results  Labs (all labs ordered are listed, but only abnormal results are displayed) Labs Reviewed - No data to display  EKG   Radiology No results found.  Procedures Procedures (including critical care time)  Medications Ordered in UC Medications - No data to display  Initial Impression / Assessment and Plan / UC Course  I have reviewed the triage vital signs and the nursing notes.  Pertinent labs & imaging results that were available during my care of the patient were reviewed by me and considered in my medical decision making (see chart for details).  Blepharitis, chalazion Erythromycin ointment nightly x 5 nights  Warm compress, gentle massage Reassurance provided no conjunctivitis symptoms currently Return if needed Agrees to plan  Final Clinical Impressions(s) / UC Diagnoses   Final diagnoses:  Blepharitis of left  lower eyelid, unspecified type  Chalazion left lower eyelid     Discharge Instructions      Warm compress applied 10-15 minutes, several times daily Gentle massage of the lower eyelid For comfort you can use the eye ointment at bedtime.  Allow several days for improvement Return if needed     ED Prescriptions     Medication Sig Dispense Auth. Provider   erythromycin ophthalmic ointment Place a 1/2 inch ribbon of ointment into the left lower eyelid nightly for 5 nights 3.5 g Khaled Herda, [place], PA-C      PDMP not reviewed this encounter.   Lorenzo Pereyra, [place], PA-C 06/04/24 1120

## 2024-06-04 NOTE — ED Triage Notes (Addendum)
 Pt presents with a chief complaint of left eye problem x 2 days. States she is experiencing left eye redness and swelling. Very sore to the touch. Currently rates overall pain a 1/10. No OTC medications taken or applied to right eye PTA. Denies blurred vision. Reports eye is very itchy.

## 2024-06-04 NOTE — Discharge Instructions (Signed)
 Warm compress applied 10-15 minutes, several times daily Gentle massage of the lower eyelid For comfort you can use the eye ointment at bedtime.  Allow several days for improvement Return if needed

## 2024-07-11 ENCOUNTER — Ambulatory Visit: Admitting: Nurse Practitioner

## 2024-07-11 ENCOUNTER — Encounter: Payer: Self-pay | Admitting: Nurse Practitioner

## 2024-07-11 VITALS — BP 124/84 | HR 84 | Temp 97.3°F | Ht 63.0 in | Wt 225.8 lb

## 2024-07-11 DIAGNOSIS — Z0000 Encounter for general adult medical examination without abnormal findings: Secondary | ICD-10-CM | POA: Diagnosis not present

## 2024-07-11 MED ORDER — ZEPBOUND 2.5 MG/0.5ML ~~LOC~~ SOAJ: 2.5000 mg | SUBCUTANEOUS | 1 refills | Status: AC

## 2024-07-11 NOTE — Assessment & Plan Note (Signed)
 Health maintenance reviewed and updated. Discussed nutrition, exercise. Follow-up 1 year.

## 2024-07-11 NOTE — Assessment & Plan Note (Addendum)
 BMI 40.9. Weight has plateaued at 230 lbs post-pregnancy. She is interested in Zepbound  for weight loss, which may aid in reducing food noise. Potential side effects include nausea and constipation. Prescribed Zepbound  2.5mg  injection weekly, pending insurance approval. Advised using backup contraception during initial use and dose increases. Encouraged small dietary changes, such as reducing eating out and increasing protein and vegetable intake. Recommended walking and treadmill use for exercise. Follow-up scheduled in 4-6 weeks to assess progress.  .vital

## 2024-07-11 NOTE — Patient Instructions (Signed)
 It was great to see you!  Start zepbound  2.5mg  injection once a week in your thigh, stomach, or arm   Small changes - getting protein and veggies with every meal, eating out less often, exercise   Let's follow-up in 4-6 weeks, sooner if you have concerns.  If a referral was placed today, you will be contacted for an appointment. Please note that routine referrals can sometimes take up to 3-4 weeks to process. Please call our office if you haven't heard anything after this time frame.  Take care,  Tinnie Harada, NP

## 2024-07-11 NOTE — Progress Notes (Signed)
 BP 124/84 (BP Location: Left Arm, Patient Position: Sitting, Cuff Size: Large)   Pulse 84   Temp (!) 97.3 F (36.3 C)   Ht 5' 3 (1.6 m)   Wt 225 lb 12.8 oz (102.4 kg)   LMP 06/14/2024 (Approximate)   SpO2 97%   Breastfeeding No   BMI 40.00 kg/m    Subjective:    Patient ID: [name] [name], female    DOB: 21-Jun-1991, 33 y.o.   MRN: [id number]  CC: Chief Complaint  Patient presents with   Annual Exam    With non-fasting labs, concerns with health and weight    HPI: [name] [name] is a 33 y.o. female presenting on 07/11/2024 for comprehensive medical examination. Current medical complaints include:weight  Discussed the use of AI scribe software for clinical note transcription with the patient, who gave verbal consent to proceed.  History of Present Illness   [name] [name] is a 33 year old female who presents for a physical exam and to discuss weight management post-pregnancy.  Her weight has plateaued at about 230 lb after losing about 30 lb postpartum in the last 3 months. She has avoided exercise because of concern about injuring her leg but wants to increase walking using a home treadmill. She has a history of binge eating that has improved somewhat. She eats out more than she cooks at home because of newborn care demands.  She previously used Contrave  for weight management but stopped due to severe nausea. She is interested in other weight loss medications to help with food cravings and joint pain at her current weight. She is not currently breast feeding.      She currently lives with: husband, 3 kids Menopausal Symptoms: no  Depression and Anxiety Screen done today and results listed below:     07/11/2024    9:13 AM 08/17/2022    4:37 PM 07/15/2022    4:32 PM  Depression screen PHQ 2/9  Decreased Interest 0 0 1  Down, Depressed, Hopeless 0 0 2  PHQ - 2 Score 0 0 3  Altered sleeping 0 2 3  Tired, decreased energy 0 2 2  Change in appetite 2 3 3   Feeling bad  or failure about yourself  0 0 2  Trouble concentrating 0 0 2  Moving slowly or fidgety/restless 0 0 1  Suicidal thoughts 0 0 1  PHQ-9 Score 2 7  17    Difficult doing work/chores Somewhat difficult Somewhat difficult Somewhat difficult     Data saved with a previous flowsheet row definition      07/11/2024    9:14 AM 08/17/2022    4:37 PM 07/15/2022    4:32 PM  GAD 7 : Generalized Anxiety Score  Nervous, Anxious, on Edge 0 0 3  Control/stop worrying 0 0 3  Worry too much - different things 0 0 3  Trouble relaxing 0 0 2  Restless 0 0 2  Easily annoyed or irritable 0 0 3  Afraid - awful might happen 1 0 3  Total GAD 7 Score 1 0 19  Anxiety Difficulty Not difficult at all Not difficult at all Very difficult    The patient does not have a history of falls. I did not complete a risk assessment for falls. A plan of care for falls was not documented.   Past Medical History:  Past Medical History:  Diagnosis Date   Abnormal Pap smear of cervix    Anxiety    Breast feeding  status of mother    Migraine     Surgical History:  Past Surgical History:  Procedure Laterality Date   CESAREAN SECTION N/A 04/15/2024   Procedure: CESAREAN DELIVERY;  Surgeon: Dannielle Bouchard, DO;  Location: [facility];  Service: Obstetrics;  Laterality: N/A;   TIBIA IM NAIL INSERTION Right 11/21/2023   Procedure: INSERTION, INTRAMEDULLARY ROD, TIBIA;  Surgeon: Kendal Franky SQUIBB, MD;  Location: [facility] OR;  Service: Orthopedics;  Laterality: Right;   TONSILLECTOMY      Medications:  Current Outpatient Medications on File Prior to Visit  Medication Sig   norethindrone (MICRONOR) 0.35 MG tablet Take 1 tablet by mouth daily.   valACYclovir  (VALTREX ) 500 MG tablet Take 1 tablet (500 mg total) by mouth 2 (two) times daily. (Patient taking differently: Take 500 mg by mouth as needed.)   erythromycin  ophthalmic ointment Place a 1/2 inch ribbon of ointment into the left lower eyelid nightly for 5 nights (Patient not  taking: Reported on 07/11/2024)   No current facility-administered medications on file prior to visit.    Allergies:  Allergies[1]  Social History:  Social History   Socioeconomic History   Marital status: Married    Spouse name: Not on file   Number of children: 3   Years of education: Not on file   Highest education level: Not on file  Occupational History   Not on file  Tobacco Use   Smoking status: Former    Types: E-cigarettes, Cigarettes    Passive exposure: Never   Smokeless tobacco: Never  Vaping Use   Vaping status: Former  Substance and Sexual Activity   Alcohol use: Not Currently    Comment: occ   Drug use: Not Currently    Types: Marijuana   Sexual activity: Yes    Birth control/protection: None  Other Topics Concern   Not on file  Social History Narrative   Not on file   Social Drivers of Health   Tobacco Use: Medium Risk (07/11/2024)   Patient History    Smoking Tobacco Use: Former    Smokeless Tobacco Use: Never    Passive Exposure: Never  Programmer, Applications: Not on file  Food Insecurity: No Food Insecurity (04/15/2024)   Epic    Worried About Programme Researcher, Broadcasting/film/video in the Last Year: Never true    Ran Out of Food in the Last Year: Never true  Transportation Needs: No Transportation Needs (04/15/2024)   Epic    Lack of Transportation (Medical): No    Lack of Transportation (Non-Medical): No  Physical Activity: Not on file  Stress: Not on file  Social Connections: Not on file  Intimate Partner Violence: Not At Risk (04/15/2024)   Epic    Fear of Current or Ex-Partner: No    Emotionally Abused: No    Physically Abused: No    Sexually Abused: No  Depression (PHQ2-9): Low Risk (07/11/2024)   Depression (PHQ2-9)    PHQ-2 Score: 2  Alcohol Screen: Not on file  Housing: Low Risk (04/15/2024)   Epic    Unable to Pay for Housing in the Last Year: No    Number of Times Moved in the Last Year: 0    Homeless in the Last Year: No  Utilities:  Not At Risk (04/15/2024)   Epic    Threatened with loss of utilities: No  Health Literacy: Not on file   Tobacco Use History[2] Social History   Substance and Sexual Activity  Alcohol Use Not Currently  Comment: occ    Family History:  Family History  Problem Relation Age of Onset   Polycystic ovary syndrome Mother    Endometriosis Mother    Sleep apnea Mother    Cancer Maternal Grandmother        uterine   Hypertension Paternal Grandmother    Diabetes Paternal Grandmother    Thyroid  disease Paternal Grandmother     Past medical history, surgical history, medications, allergies, family history and social history reviewed with patient today and changes made to appropriate areas of the chart.   Review of Systems  Constitutional:  Positive for malaise/fatigue. Negative for fever.  HENT: Negative.    Eyes: Negative.   Respiratory: Negative.    Cardiovascular: Negative.   Gastrointestinal: Negative.   Genitourinary: Negative.   Musculoskeletal:  Positive for joint pain (intermittent leg pain).  Skin: Negative.   Neurological: Negative.   Psychiatric/Behavioral: Negative.     All other ROS negative except what is listed above and in the HPI.      Objective:    BP 124/84 (BP Location: Left Arm, Patient Position: Sitting, Cuff Size: Large)   Pulse 84   Temp (!) 97.3 F (36.3 C)   Ht 5' 3 (1.6 m)   Wt 225 lb 12.8 oz (102.4 kg)   LMP 06/14/2024 (Approximate)   SpO2 97%   Breastfeeding No   BMI 40.00 kg/m   Wt Readings from Last 3 Encounters:  07/11/24 225 lb 12.8 oz (102.4 kg)  06/04/24 230 lb (104.3 kg)  04/15/24 258 lb (117 kg)    Body Fat %: 48.2 Fat Mass: 108.8 pounds Muscle Mass: 111 pounds Total body water : 88 pounds Visceral fat rating: 12  Physical Exam Vitals and nursing note reviewed.  Constitutional:      General: She is not in acute distress.    Appearance: Normal appearance.  HENT:     Head: Normocephalic and atraumatic.     Right Ear:  Tympanic membrane, ear canal and external ear normal.     Left Ear: Tympanic membrane, ear canal and external ear normal.     Mouth/Throat:     Mouth: Mucous membranes are moist.     Pharynx: No posterior oropharyngeal erythema.  Eyes:     Conjunctiva/sclera: Conjunctivae normal.  Cardiovascular:     Rate and Rhythm: Normal rate and regular rhythm.     Pulses: Normal pulses.     Heart sounds: Normal heart sounds.  Pulmonary:     Effort: Pulmonary effort is normal.     Breath sounds: Normal breath sounds.  Abdominal:     Palpations: Abdomen is soft.     Tenderness: There is no abdominal tenderness.  Musculoskeletal:        General: Normal range of motion.     Cervical back: Normal range of motion and neck supple.     Right lower leg: No edema.     Left lower leg: No edema.  Lymphadenopathy:     Cervical: No cervical adenopathy.  Skin:    General: Skin is warm and dry.  Neurological:     General: No focal deficit present.     Mental Status: She is alert and oriented to person, place, and time.     Cranial Nerves: No cranial nerve deficit.     Coordination: Coordination normal.     Gait: Gait normal.  Psychiatric:        Mood and Affect: Mood normal.        Behavior: Behavior  normal.        Thought Content: Thought content normal.        Judgment: Judgment normal.      Results for orders placed or performed during the hospital encounter of 04/15/24  OB RESULTS CONSOLE Rubella Antibody   Collection Time: 09/28/23 12:00 AM  Result Value Ref Range   Rubella Immune   OB RESULTS CONSOLE Hepatitis B surface antigen   Collection Time: 09/28/23 12:00 AM  Result Value Ref Range   Hepatitis B Surface Ag Negative   OB RESULT CONSOLE Group B Strep   Collection Time: 03/26/24 12:00 AM  Result Value Ref Range   GBS Negative   Type and screen [facility]   Collection Time: 04/15/24  8:55 AM  Result Value Ref Range   ABO/RH(D) A POS    Antibody Screen NEG     Sample Expiration      04/18/2024,2359 Performed at [facility], [address], [place], [place] [address]   CBC   Collection Time: 04/15/24  9:00 AM  Result Value Ref Range   WBC 10.7 (H) 4.0 - 10.5 K/uL   RBC 3.98 3.87 - 5.11 MIL/uL   Hemoglobin 11.9 (L) 12.0 - 15.0 g/dL   HCT 63.6 63.9 - 53.9 %   MCV 91.2 80.0 - 100.0 fL   MCH 29.9 26.0 - 34.0 pg   MCHC 32.8 30.0 - 36.0 g/dL   RDW 84.9 88.4 - 84.4 %   Platelets 232 150 - 400 K/uL   nRBC 0.0 0.0 - 0.2 %  Comprehensive metabolic panel with GFR   Collection Time: 04/15/24  9:00 AM  Result Value Ref Range   Sodium 137 135 - 145 mmol/L   Potassium 4.0 3.5 - 5.1 mmol/L   Chloride 107 98 - 111 mmol/L   CO2 18 (L) 22 - 32 mmol/L   Glucose, Bld 86 70 - 99 mg/dL   BUN 7 6 - 20 mg/dL   Creatinine, Ser 9.41 0.44 - 1.00 mg/dL   Calcium  8.8 (L) 8.9 - 10.3 mg/dL   Total Protein 6.1 (L) 6.5 - 8.1 g/dL   Albumin 2.7 (L) 3.5 - 5.0 g/dL   AST 19 15 - 41 U/L   ALT 16 0 - 44 U/L   Alkaline Phosphatase 92 38 - 126 U/L   Total Bilirubin 0.4 0.0 - 1.2 mg/dL   GFR, Estimated >39 >39 mL/min   Anion gap 12 5 - 15  Protein / creatinine ratio, urine   Collection Time: 04/15/24  9:00 AM  Result Value Ref Range   Creatinine, Urine 44 mg/dL   Total Protein, Urine <6 mg/dL   Protein Creatinine Ratio        0.00 - 0.15 mg/mg[Cre]  HIV Antibody (routine testing w rflx)   Collection Time: 04/15/24 12:07 PM  Result Value Ref Range   HIV Screen 4th Generation wRfx Non Reactive Non Reactive  RPR   Collection Time: 04/15/24  2:22 PM  Result Value Ref Range   RPR Ser Ql NON REACTIVE NON REACTIVE  CBC   Collection Time: 04/16/24  4:38 AM  Result Value Ref Range   WBC 18.9 (H) 4.0 - 10.5 K/uL   RBC 3.51 (L) 3.87 - 5.11 MIL/uL   Hemoglobin 10.5 (L) 12.0 - 15.0 g/dL   HCT 68.7 (L) 63.9 - 53.9 %   MCV 88.9 80.0 - 100.0 fL   MCH 29.9 26.0 - 34.0 pg   MCHC 33.7 30.0 -  36.0 g/dL   RDW 84.8 88.4 - 84.4 %   Platelets 216 150 - 400 K/uL   nRBC  0.0 0.0 - 0.2 %      Assessment & Plan:   Problem List Items Addressed This Visit       Other   Routine general medical examination at a health care facility - Primary   Health maintenance reviewed and updated. Discussed nutrition, exercise. Follow-up 1 year.        Morbid obesity (HCC)   BMI 40.9. Weight has plateaued at 230 lbs post-pregnancy. She is interested in Zepbound  for weight loss, which may aid in reducing food noise. Potential side effects include nausea and constipation. Prescribed Zepbound  2.5mg  injection weekly, pending insurance approval. Advised using backup contraception during initial use and dose increases. Encouraged small dietary changes, such as reducing eating out and increasing protein and vegetable intake. Recommended walking and treadmill use for exercise. Follow-up scheduled in 4-6 weeks to assess progress.  .vital       Relevant Medications   tirzepatide  (ZEPBOUND ) 2.5 MG/0.5ML Pen     Follow up plan: Return in about 4 weeks (around 08/08/2024) for 4-6 weeks weight management .   LABORATORY TESTING:  - Pap smear: up to date  IMMUNIZATIONS:   - Tdap: Tetanus vaccination status reviewed: last tetanus booster within 10 years. - Influenza: Declined - Pneumovax: Not applicable - Prevnar: Not applicable - HPV: Declined - Shingrix vaccine: Not applicable  SCREENING: -Mammogram: Not applicable  - Colonoscopy: Not applicable  - Bone Density: Not applicable   PATIENT COUNSELING:   Advised to take 1 mg of folate supplement per day if capable of pregnancy.   Sexuality: Discussed sexually transmitted diseases, partner selection, use of condoms, avoidance of unintended pregnancy  and contraceptive alternatives.   Advised to avoid cigarette smoking.  I discussed with the patient that most people either abstain from alcohol or drink within safe limits (<=14/week and <=4 drinks/occasion for males, <=7/weeks and <= 3 drinks/occasion for females) and that  the risk for alcohol disorders and other health effects rises proportionally with the number of drinks per week and how often a drinker exceeds daily limits.  Discussed cessation/primary prevention of drug use and availability of treatment for abuse.   Diet: Encouraged to adjust caloric intake to maintain  or achieve ideal body weight, to reduce intake of dietary saturated fat and total fat, to limit sodium intake by avoiding high sodium foods and not adding table salt, and to maintain adequate dietary potassium and calcium  preferably from fresh fruits, vegetables, and low-fat dairy products.    stressed the importance of regular exercise  Injury prevention: Discussed safety belts, safety helmets, smoke detector, smoking near bedding or upholstery.   Dental health: Discussed importance of regular tooth brushing, flossing, and dental visits.    NEXT PREVENTATIVE PHYSICAL DUE IN 1 YEAR. Return in about 4 weeks (around 08/08/2024) for 4-6 weeks weight management .  [name] [name] A Hayden Mabin     [1] No Known Allergies [2]  Social History Tobacco Use  Smoking Status Former   Types: E-cigarettes, Cigarettes   Passive exposure: Never  Smokeless Tobacco Never

## 2024-08-22 ENCOUNTER — Ambulatory Visit: Admitting: Nurse Practitioner
